# Patient Record
Sex: Male | Born: 1975 | Race: White | Hispanic: No | Marital: Married | Smoking: Former smoker
Health system: Southern US, Community
[De-identification: ages and names within clinical notes are randomized; demographics above are authoritative.]

## PROBLEM LIST (undated history)

## (undated) DIAGNOSIS — N201 Calculus of ureter: Secondary | ICD-10-CM

## (undated) DIAGNOSIS — Z87442 Personal history of urinary calculi: Secondary | ICD-10-CM

## (undated) DIAGNOSIS — K219 Gastro-esophageal reflux disease without esophagitis: Secondary | ICD-10-CM

## (undated) DIAGNOSIS — M545 Low back pain, unspecified: Secondary | ICD-10-CM

## (undated) DIAGNOSIS — F319 Bipolar disorder, unspecified: Secondary | ICD-10-CM

## (undated) DIAGNOSIS — Z87898 Personal history of other specified conditions: Secondary | ICD-10-CM

## (undated) DIAGNOSIS — Z915 Personal history of self-harm: Secondary | ICD-10-CM

## (undated) DIAGNOSIS — F32A Depression, unspecified: Secondary | ICD-10-CM

## (undated) DIAGNOSIS — H5440 Blindness, one eye, unspecified eye: Secondary | ICD-10-CM

## (undated) DIAGNOSIS — F329 Major depressive disorder, single episode, unspecified: Secondary | ICD-10-CM

## (undated) DIAGNOSIS — Z87828 Personal history of other (healed) physical injury and trauma: Secondary | ICD-10-CM

## (undated) DIAGNOSIS — Z973 Presence of spectacles and contact lenses: Secondary | ICD-10-CM

## (undated) DIAGNOSIS — K760 Fatty (change of) liver, not elsewhere classified: Secondary | ICD-10-CM

## (undated) DIAGNOSIS — F419 Anxiety disorder, unspecified: Secondary | ICD-10-CM

## (undated) DIAGNOSIS — R918 Other nonspecific abnormal finding of lung field: Secondary | ICD-10-CM

## (undated) DIAGNOSIS — Z872 Personal history of diseases of the skin and subcutaneous tissue: Secondary | ICD-10-CM

## (undated) DIAGNOSIS — K449 Diaphragmatic hernia without obstruction or gangrene: Secondary | ICD-10-CM

## (undated) DIAGNOSIS — G8929 Other chronic pain: Secondary | ICD-10-CM

## (undated) DIAGNOSIS — M1990 Unspecified osteoarthritis, unspecified site: Secondary | ICD-10-CM

## (undated) HISTORY — DX: Major depressive disorder, single episode, unspecified: F32.9

## (undated) HISTORY — PX: EXTRACORPOREAL SHOCK WAVE LITHOTRIPSY: SHX1557

## (undated) HISTORY — DX: Depression, unspecified: F32.A

## (undated) HISTORY — DX: Blindness, one eye, unspecified eye: H54.40

---

## 1997-07-21 HISTORY — PX: APPENDECTOMY: SHX54

## 1999-07-23 ENCOUNTER — Encounter (INDEPENDENT_AMBULATORY_CARE_PROVIDER_SITE_OTHER): Payer: Self-pay | Admitting: Specialist

## 1999-07-23 ENCOUNTER — Inpatient Hospital Stay (HOSPITAL_COMMUNITY): Admission: EM | Admit: 1999-07-23 | Discharge: 1999-07-24 | Payer: Self-pay | Admitting: Emergency Medicine

## 2000-07-21 ENCOUNTER — Inpatient Hospital Stay (HOSPITAL_COMMUNITY): Admission: EM | Admit: 2000-07-21 | Discharge: 2000-07-31 | Payer: Self-pay | Admitting: *Deleted

## 2000-07-21 DIAGNOSIS — Z9151 Personal history of suicidal behavior: Secondary | ICD-10-CM

## 2000-07-21 HISTORY — DX: Personal history of suicidal behavior: Z91.51

## 2003-02-15 ENCOUNTER — Emergency Department (HOSPITAL_COMMUNITY): Admission: EM | Admit: 2003-02-15 | Discharge: 2003-02-15 | Payer: Self-pay | Admitting: *Deleted

## 2004-08-26 ENCOUNTER — Emergency Department (HOSPITAL_COMMUNITY): Admission: EM | Admit: 2004-08-26 | Discharge: 2004-08-26 | Payer: Self-pay | Admitting: Emergency Medicine

## 2006-10-15 ENCOUNTER — Ambulatory Visit (HOSPITAL_COMMUNITY): Admission: RE | Admit: 2006-10-15 | Discharge: 2006-10-15 | Payer: Self-pay | Admitting: Sports Medicine

## 2006-11-11 ENCOUNTER — Encounter: Admission: RE | Admit: 2006-11-11 | Discharge: 2007-02-09 | Payer: Self-pay | Admitting: Sports Medicine

## 2007-04-01 ENCOUNTER — Encounter: Admission: RE | Admit: 2007-04-01 | Discharge: 2007-06-28 | Payer: Self-pay | Admitting: Sports Medicine

## 2008-07-17 ENCOUNTER — Encounter: Payer: Self-pay | Admitting: Orthopedic Surgery

## 2008-08-16 ENCOUNTER — Ambulatory Visit: Payer: Self-pay | Admitting: Orthopedic Surgery

## 2008-08-16 DIAGNOSIS — M549 Dorsalgia, unspecified: Secondary | ICD-10-CM | POA: Insufficient documentation

## 2008-08-22 ENCOUNTER — Encounter: Admission: RE | Admit: 2008-08-22 | Discharge: 2008-10-31 | Payer: Self-pay | Admitting: Orthopedic Surgery

## 2008-08-22 ENCOUNTER — Encounter: Payer: Self-pay | Admitting: Orthopedic Surgery

## 2008-09-05 ENCOUNTER — Telehealth: Payer: Self-pay | Admitting: Orthopedic Surgery

## 2008-09-21 ENCOUNTER — Encounter: Payer: Self-pay | Admitting: Orthopedic Surgery

## 2008-11-01 ENCOUNTER — Encounter: Payer: Self-pay | Admitting: Orthopedic Surgery

## 2009-03-22 ENCOUNTER — Emergency Department (HOSPITAL_COMMUNITY): Admission: EM | Admit: 2009-03-22 | Discharge: 2009-03-22 | Payer: Self-pay | Admitting: Emergency Medicine

## 2009-04-07 ENCOUNTER — Emergency Department (HOSPITAL_COMMUNITY): Admission: EM | Admit: 2009-04-07 | Discharge: 2009-04-07 | Payer: Self-pay | Admitting: Emergency Medicine

## 2009-04-18 ENCOUNTER — Emergency Department (HOSPITAL_COMMUNITY): Admission: EM | Admit: 2009-04-18 | Discharge: 2009-04-18 | Payer: Self-pay | Admitting: Emergency Medicine

## 2009-05-07 ENCOUNTER — Emergency Department (HOSPITAL_COMMUNITY): Admission: EM | Admit: 2009-05-07 | Discharge: 2009-05-07 | Payer: Self-pay | Admitting: Emergency Medicine

## 2009-05-09 ENCOUNTER — Ambulatory Visit (HOSPITAL_COMMUNITY): Admission: RE | Admit: 2009-05-09 | Discharge: 2009-05-09 | Payer: Self-pay | Admitting: Orthopedic Surgery

## 2009-05-15 ENCOUNTER — Emergency Department (HOSPITAL_COMMUNITY): Admission: EM | Admit: 2009-05-15 | Discharge: 2009-05-15 | Payer: Self-pay | Admitting: Emergency Medicine

## 2009-05-25 ENCOUNTER — Emergency Department (HOSPITAL_COMMUNITY): Admission: EM | Admit: 2009-05-25 | Discharge: 2009-05-25 | Payer: Self-pay | Admitting: Emergency Medicine

## 2009-06-06 ENCOUNTER — Emergency Department (HOSPITAL_COMMUNITY): Admission: EM | Admit: 2009-06-06 | Discharge: 2009-06-07 | Payer: Self-pay | Admitting: Emergency Medicine

## 2009-07-16 ENCOUNTER — Emergency Department (HOSPITAL_COMMUNITY): Admission: EM | Admit: 2009-07-16 | Discharge: 2009-07-16 | Payer: Self-pay | Admitting: Emergency Medicine

## 2009-09-06 ENCOUNTER — Emergency Department (HOSPITAL_COMMUNITY): Admission: EM | Admit: 2009-09-06 | Discharge: 2009-09-06 | Payer: Self-pay | Admitting: Emergency Medicine

## 2009-09-24 ENCOUNTER — Emergency Department (HOSPITAL_COMMUNITY): Admission: EM | Admit: 2009-09-24 | Discharge: 2009-09-25 | Payer: Self-pay | Admitting: Emergency Medicine

## 2009-10-12 ENCOUNTER — Ambulatory Visit (HOSPITAL_COMMUNITY): Admission: RE | Admit: 2009-10-12 | Discharge: 2009-10-12 | Payer: Self-pay | Admitting: Family Medicine

## 2009-11-09 ENCOUNTER — Emergency Department (HOSPITAL_COMMUNITY): Admission: EM | Admit: 2009-11-09 | Discharge: 2009-11-10 | Payer: Self-pay | Admitting: Emergency Medicine

## 2009-11-09 ENCOUNTER — Encounter: Payer: Self-pay | Admitting: Orthopedic Surgery

## 2009-11-09 DIAGNOSIS — Z87828 Personal history of other (healed) physical injury and trauma: Secondary | ICD-10-CM

## 2009-11-09 HISTORY — DX: Personal history of other (healed) physical injury and trauma: Z87.828

## 2009-11-12 ENCOUNTER — Ambulatory Visit: Payer: Self-pay | Admitting: Orthopedic Surgery

## 2009-11-12 DIAGNOSIS — S92919A Unspecified fracture of unspecified toe(s), initial encounter for closed fracture: Secondary | ICD-10-CM | POA: Insufficient documentation

## 2009-11-26 ENCOUNTER — Ambulatory Visit: Payer: Self-pay | Admitting: Orthopedic Surgery

## 2009-12-06 ENCOUNTER — Emergency Department (HOSPITAL_COMMUNITY): Admission: EM | Admit: 2009-12-06 | Discharge: 2009-12-06 | Payer: Self-pay | Admitting: Emergency Medicine

## 2009-12-10 ENCOUNTER — Ambulatory Visit: Payer: Self-pay | Admitting: Orthopedic Surgery

## 2009-12-18 ENCOUNTER — Telehealth: Payer: Self-pay | Admitting: Orthopedic Surgery

## 2009-12-20 ENCOUNTER — Telehealth: Payer: Self-pay | Admitting: Orthopedic Surgery

## 2010-01-07 ENCOUNTER — Telehealth (INDEPENDENT_AMBULATORY_CARE_PROVIDER_SITE_OTHER): Payer: Self-pay | Admitting: *Deleted

## 2010-01-08 ENCOUNTER — Telehealth: Payer: Self-pay | Admitting: Orthopedic Surgery

## 2010-02-18 ENCOUNTER — Ambulatory Visit: Payer: Self-pay | Admitting: Orthopedic Surgery

## 2010-02-19 ENCOUNTER — Encounter (INDEPENDENT_AMBULATORY_CARE_PROVIDER_SITE_OTHER): Payer: Self-pay | Admitting: *Deleted

## 2010-02-25 ENCOUNTER — Emergency Department (HOSPITAL_COMMUNITY): Admission: EM | Admit: 2010-02-25 | Discharge: 2010-02-26 | Payer: Self-pay | Admitting: Emergency Medicine

## 2010-03-12 ENCOUNTER — Emergency Department (HOSPITAL_COMMUNITY): Admission: EM | Admit: 2010-03-12 | Discharge: 2010-03-12 | Payer: Self-pay | Admitting: Emergency Medicine

## 2010-04-17 ENCOUNTER — Emergency Department (HOSPITAL_COMMUNITY): Admission: EM | Admit: 2010-04-17 | Discharge: 2010-04-17 | Payer: Self-pay | Admitting: Emergency Medicine

## 2010-04-25 ENCOUNTER — Emergency Department (HOSPITAL_COMMUNITY): Admission: EM | Admit: 2010-04-25 | Discharge: 2010-04-25 | Payer: Self-pay | Admitting: Emergency Medicine

## 2010-04-26 ENCOUNTER — Encounter (INDEPENDENT_AMBULATORY_CARE_PROVIDER_SITE_OTHER): Payer: Self-pay | Admitting: *Deleted

## 2010-05-01 ENCOUNTER — Ambulatory Visit: Payer: Self-pay | Admitting: Internal Medicine

## 2010-05-01 ENCOUNTER — Observation Stay (HOSPITAL_COMMUNITY): Admission: EM | Admit: 2010-05-01 | Discharge: 2010-05-02 | Payer: Self-pay | Admitting: Emergency Medicine

## 2010-05-02 ENCOUNTER — Encounter: Payer: Self-pay | Admitting: Internal Medicine

## 2010-05-03 ENCOUNTER — Encounter: Payer: Self-pay | Admitting: Internal Medicine

## 2010-05-03 ENCOUNTER — Emergency Department (HOSPITAL_COMMUNITY): Admission: EM | Admit: 2010-05-03 | Discharge: 2010-05-03 | Payer: Self-pay | Admitting: Emergency Medicine

## 2010-05-09 ENCOUNTER — Ambulatory Visit: Payer: Self-pay | Admitting: Internal Medicine

## 2010-05-09 ENCOUNTER — Encounter: Payer: Self-pay | Admitting: Internal Medicine

## 2010-05-09 DIAGNOSIS — L509 Urticaria, unspecified: Secondary | ICD-10-CM | POA: Insufficient documentation

## 2010-05-09 DIAGNOSIS — R231 Pallor: Secondary | ICD-10-CM

## 2010-05-09 DIAGNOSIS — R740 Nonspecific elevation of levels of transaminase and lactic acid dehydrogenase [LDH]: Secondary | ICD-10-CM

## 2010-05-13 ENCOUNTER — Telehealth: Payer: Self-pay | Admitting: Internal Medicine

## 2010-05-13 ENCOUNTER — Ambulatory Visit: Payer: Self-pay | Admitting: Internal Medicine

## 2010-06-07 ENCOUNTER — Emergency Department (HOSPITAL_COMMUNITY): Admission: EM | Admit: 2010-06-07 | Discharge: 2010-06-07 | Payer: Self-pay | Admitting: Emergency Medicine

## 2010-06-27 ENCOUNTER — Emergency Department (HOSPITAL_COMMUNITY): Admission: EM | Admit: 2010-06-27 | Discharge: 2010-05-03 | Payer: Self-pay | Admitting: Emergency Medicine

## 2010-07-01 ENCOUNTER — Ambulatory Visit: Payer: Self-pay | Admitting: Internal Medicine

## 2010-08-13 ENCOUNTER — Ambulatory Visit: Admit: 2010-08-13 | Payer: Self-pay
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
## 2010-08-20 NOTE — Miscellaneous (Signed)
Summary: refaxed notes to dr Ulice Brilliant  Clinical Lists Changes

## 2010-08-20 NOTE — Progress Notes (Signed)
Summary: call from patient still hurting  Phone Note Call from Patient   Caller: Patient Summary of Call: Patient called to request to speak w/nurse. States per last visit note, advised to contact our office after healed.  Said color of toe is "purplish-pink" and still hurts.  Asked about possibility of having something called in for pain.  I advised Dr Romeo Apple is out of the office this week.  I've scheduled for an appointment.  Still asked to speak w/nurse. Ph [phone] Initial call taken by: Cammie Sickle,  January 07, 2010 11:37 AM  Follow-up for Phone Call        called and lmom to call here Follow-up by: Ether Griffins,  January 08, 2010 8:36 AM

## 2010-08-20 NOTE — Letter (Signed)
Summary: History form  History form   Imported By: Jacklynn Ganong 11/13/2009 11:40:37  _____________________________________________________________________  External Attachment:    Type:   Image     Comment:   External Document
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
## 2010-08-20 NOTE — Progress Notes (Signed)
Summary: wants refill on Percocet 5  Phone Note Call from Patient Call back at [facility] Phone [phone]   Summary of Call: wants refill on Percocet 5, wants to pick up this thursday, he is going to be out of town for 2 weeks, leaving out Thursday,  Initial call taken by: Ether Griffins,  Dec 18, 2009 10:33 AM  Follow-up for Phone Call        this is restricted can not be filled early Follow-up by: Fuller Canada MD,  Dec 18, 2009 1:00 PM  Additional Follow-up for Phone Call Additional follow up Details #1::        his next refill is due for sunday 12/23/09 advised he can pick his rx up Friday, to call us thursday, we will put date on for 12/23/09 Additional Follow-up by: Ether Griffins,  Dec 18, 2009 1:15 PM

## 2010-08-20 NOTE — Progress Notes (Signed)
Summary: percocet rx needed  Phone Note Call from Patient Call back at Home Phone [phone]   Summary of Call: he is due for percocet 5 now, will you write please, thanks Initial call taken by: Chasity Tereasa Coop,  December 20, 2009 8:29 AM    Prescriptions: PERCOCET 5-325 MG TABS (OXYCODONE-ACETAMINOPHEN) 1-2 by mouth q 4 as needed pain  #90 x 0   Entered and Authorized by:   Fuller Canada MD   Signed by:   Fuller Canada MD on 12/20/2009   Method used:   Print then Give to Patient   RxID:   [id number]

## 2010-08-20 NOTE — Assessment & Plan Note (Signed)
Summary: NEW TO CLINIC/HFU/SB.   Vital Signs:  Patient profile:   35 year old male Height:      72 inches Weight:      221.5 pounds BMI:     30.15 Temp:     98.9 degrees F oral Pulse rate:   112 / minute BP sitting:   132 / 90  (right arm)  Vitals Entered By: Filomena Jungling NT II (May 09, 2010 1:57 PM) CC: HFU Is Patient Diabetic? No Pain Assessment Patient in pain? yes     Location: back Intensity: 8 Type: aching Onset of pain  3 YEARS Nutritional Status BMI of > 30 = obese  Have you ever been in a relationship where you felt threatened, hurt or afraid?No   Does patient need assistance? Functional Status Self care Ambulation Normal   Primary Care Provider:  Bethel Born MD  CC:  HFU.  History of Present Illness: 35 y/o m with h/o chronic back pain, bipolar, anxiety and urticaria for which he was recently discharged from ED comes for follow up.   his urticaria is reolved and he has completed prednsione.   he complaints of big rash on this back. the rash on the back is popped up 1 yr ago and never went away. it gets darker as the day progresses. he has some blisters on the back which pop up periodically and are sore. has seen a dermatologist in [place] a year ago and was told that its too deep in the skin for him to do anything about it.   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 has seen sports medicine and PT. hydrocortosone shot gave him a skin reaction. PT made the pain worse. tried vicodin, percocet, tramadol, naproxen, tylenol and ibuprofen.     Preventive Screening-Counseling & Management  Alcohol-Tobacco     Smoking Status: current     Packs/Day: 1  Caffeine-Diet-Exercise     Caffeine use/day: 5+  Current Medications (verified): 1)  Valium 10 Mg Tabs (Diazepam) .Marland Kitchen.. 1 Tab Four Times Daily 2)  Zantac 150 Mg Tabs (Ranitidine Hcl) .Marland Kitchen.. 1 Tablet By Mouth Daily 3)  Seroquel 25 Mg Tabs (Quetiapine  Fumarate) .Marland Kitchen.. 1 Tablet By Mouth At Bedtime 4)  Depakote 500 Mg Tbec (Divalproex Sodium) .Marland Kitchen.. 1 Tablet By Mouth At Bedtime  Allergies (verified): No Known Drug Allergies  Review of Systems  The patient denies anorexia, fever, weight loss, weight gain, vision loss, decreased hearing, hoarseness, chest pain, syncope, dyspnea on exertion, peripheral edema, prolonged cough, headaches, hemoptysis, abdominal pain, melena, hematochezia, severe indigestion/heartburn, hematuria, incontinence, genital sores, muscle weakness, suspicious skin lesions, transient blindness, difficulty walking, depression, unusual weight change, abnormal bleeding, enlarged lymph nodes, angioedema, breast masses, and testicular masses.    Physical Exam  General:  Gen: VS reveiwed, Alert, well developed, nodistress ENT: mucous membranes pink & moist. No abnormal finds in ear and nose. CVC:S1 S2 , no murmurs, no abnormal heart sounds. Lungs: Clear to auscultation B/L. No wheezes, crackles or other abnormal sounds Abdomen: soft, non distended, no tender. Normal Bowel sounds EXT: no pitting edema, no engorged veins, Pulsations normal  Neuro:alert, oriented *3, cranial nerved 2-12 intact, strenght normal in all  extremities, senstations normal to light touch.      Impression & Recommendations:  Problem # 1:  BACK PAIN (ICD-724.5) patient has been to multiple physcians, has tried all other meds including narcotics request narcotics today he has high abuse potential.  also he describes PT and hydrocortisone shots made his pain worse. I would like to try repeating PT at different place before starting narcotics in him. Will talk more once he gets his orange cards diclofenac for pain until then  His updated medication list for this problem includes:    Diclofenac Sodium 75 Mg Tbec (Diclofenac sodium) .Marland Kitchen... 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 will get onc he gets his orange card  Problem # 4:  BIPOLAR AFFECTIVE DISORDER, DEPRESSED, MODERATE (ICD-296.52) managed by mental health  Problem # 5:  UNSPECIFIED URTICARIA (ICD-708.9) resolved  Problem # 6:  INADEQUATE MATERIAL RESOURCES (ICD-V60.2) meet with Jaynee Eagles today for orange card  Complete Medication List: 1)  Valium 10 Mg Tabs (Diazepam) .Marland Kitchen.. 1 tab four times daily 2)  Zantac 150 Mg Tabs (Ranitidine hcl) .Marland Kitchen.. 1 tablet by mouth daily 3)  Seroquel 25 Mg Tabs (Quetiapine fumarate) .Marland Kitchen.. 1 tablet by mouth at bedtime 4)  Depakote 500 Mg Tbec (Divalproex sodium) .Marland Kitchen.. 1 tablet by mouth at bedtime 5)  Diclofenac Sodium 75 Mg Tbec (Diclofenac sodium) .... Take 1 tablet by mouth three times a day  Other Orders: T-CMP with Estimated GFR ([id number])  Patient Instructions: 1)  Please schedule a follow-up appointment in 3 months.  2)  See Jaynee Eagles for medication and insurance assistance Prescriptions: DICLOFENAC SODIUM 75 MG TBEC (DICLOFENAC SODIUM) Take 1 tablet by mouth three times a day  #90 x 0   Entered and Authorized by:   Bethel Born MD   Signed by:   Bethel Born MD on 05/09/2010   Method used:   Print then Give to Patient   RxID:   [id number] DICLOFENAC SODIUM 50 MG TBEC (DICLOFENAC SODIUM) Take 1 tablet by mouth three times a day  #90 x 0   Entered and Authorized by:   Bethel Born MD   Signed by:   Bethel Born MD on 05/09/2010   Method used:   Print then Give to Patient   RxID:   [id number]    Orders Added: 1)  T-CMP with Estimated GFR [[id number]] 2)  Est. Patient Level IV [02725]    Prevention & Chronic Care Immunizations   Influenza vaccine: Not  documented    Tetanus booster: Not documented    Pneumococcal vaccine: Not documented  Other Screening   Smoking status: current  (05/09/2010)  Lipids   Total Cholesterol: Not documented   LDL: Not documented   LDL Direct: Not documented   HDL: Not documented   Triglycerides: Not documented   Process Orders Check Orders Results:     [facility]: ABN not required for this insurance Tests Sent for requisitioning (May 09, 2010 3:27 PM):     05/09/2010: [facility] -- T-CMP with Estimated GFR [[id number]] (signed)
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
## 2010-08-20 NOTE — Miscellaneous (Signed)
Summary: PATIENT CONSENT FORM  PATIENT CONSENT FORM   Imported By: Louretta Parma 05/13/2010 16:54:32  _____________________________________________________________________  External Attachment:    Type:   Image     Comment:   External Document

## 2010-08-20 NOTE — Progress Notes (Signed)
Summary: phone/gg  Phone Note Call from Patient   Caller: Patient Summary of Call: Pt called and states he was given a Rx for  Diclofenac Sodium 75 Mg  and it's not helping his back pain.  He had some at home and tried it over the week-end.  He is requestion something else for the pain. Pt # [id number] Initial call taken by: Merrie Roof RN,  May 13, 2010 8:53 AM  Follow-up for Phone Call        patient will need to come in for pain contract for narcotics will prescribe vicodin on chronic bases for back pain  Follow-up by: Bethel Born MD,  May 13, 2010 11:29 AM     Appended Document: phone/gg Pt will be seen today
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
## 2010-08-20 NOTE — Discharge Summary (Signed)
Summary: Hospital Discharge Update    Hospital Discharge Update:  Date of Admission: 05/01/2010 Date of Discharge: 05/02/2010  Brief Summary:  35  yo male admitted with urticaria x2 days on trunk and upper extremities.  Patient was treated with Prednisone 40mg  and responded well to treatment.  He also had a elevated WBC which likely due to demargination/steroid.  He had a history of elevated transaminases so we ordered Hepatitis Panel.  Patient was sent home with Prednisone tapering x 3 days.  Lab or other results pending at discharge:  Hepatitis panel  Labs needed at follow-up: CBC with differential  Other labs needed at follow-up: Please follow leukocytosis and hepatitis panel  Other follow-up issues:  Questionable drug-seeking behavior?  Medication list changes:  Removed medication of ROBAXIN 500 MG TABS (METHOCARBAMOL) 1 by mouth Q 6 as needed Removed medication of NAPROSYN 500 MG TABS (NAPROXEN) 1 by mouth two times a day Removed medication of PERCOCET 5-325 MG TABS (OXYCODONE-ACETAMINOPHEN) 1-2 by mouth q 4 as needed pain Removed medication of KEFLEX 500 MG CAPS (CEPHALEXIN) 1 q 6 as needed pain Removed medication of NORCO 5-325 MG TABS (HYDROCODONE-ACETAMINOPHEN) 1 by mouth q 4 hrs as needed Removed medication of ULTRACET 37.5-325 MG TABS (TRAMADOL-ACETAMINOPHEN) 1 q 4 as needed pain Added new medication of ZYRTEC ALLERGY 10 MG TABS (CETIRIZINE HCL) 1 tablet by mouth once daily - Signed Added new medication of PREDNISONE 10 MG TABS (PREDNISONE) Take 3 tablets day 1, then 2 tablets day 2, then 1 tablet day 3, then stop - Signed Added new medication of VALIUM 10 MG TABS (DIAZEPAM) 1 tab four times daily - Signed Added new medication of ZANTAC 150 MG TABS (RANITIDINE HCL) 1 tablet by mouth daily - Signed Added new medication of SEROQUEL 25 MG TABS (QUETIAPINE FUMARATE) 1 tablet by mouth at bedtime Added new medication of DEPAKOTE 500 MG TBEC (DIVALPROEX SODIUM) 1 tablet by  mouth at bedtime Rx of ZYRTEC ALLERGY 10 MG TABS (CETIRIZINE HCL) 1 tablet by mouth once daily;  #14 x 0;  Signed;  Entered by: Rosana Berger MD;  Authorized by: Rosana Berger MD;  Method used: Electronically to [facility] [phone]*, [address], [place], [place], [place]  [address], Ph: [phone] or [phone], Fax: [phone] Rx of PREDNISONE 10 MG TABS (PREDNISONE) Take 3 tablets day 1, then 2 tablets day 2, then 1 tablet day 3, then stop;  #6 x 0;  Signed;  Entered by: Rosana Berger MD;  Authorized by: Rosana Berger MD;  Method used: Historical Rx of VALIUM 10 MG TABS (DIAZEPAM) 1 tab four times daily;  #120 x 0;  Signed;  Entered by: Rosana Berger MD;  Authorized by: Rosana Berger MD;  Method used: Historical Rx of ZANTAC 150 MG TABS (RANITIDINE HCL) 1 tablet by mouth daily;  #30 x 0;  Signed;  Entered by: Rosana Berger MD;  Authorized by: Rosana Berger MD;  Method used: Historical  The medication, problem, and allergy lists have been updated.  Please see the dictated discharge summary for details.  Discharge medications:  ZYRTEC ALLERGY 10 MG TABS (CETIRIZINE HCL) 1 tablet by mouth once daily PREDNISONE 10 MG TABS (PREDNISONE) Take 3 tablets day 1, then 2 tablets day 2, then 1 tablet day 3, then stop VALIUM 10 MG TABS (DIAZEPAM) 1 tab four times daily ZANTAC 150 MG TABS (RANITIDINE HCL) 1 tablet by mouth daily SEROQUEL 25 MG TABS (QUETIAPINE FUMARATE) 1 tablet by mouth at bedtime DEPAKOTE 500  MG TBEC (DIVALPROEX SODIUM) 1 tablet by mouth at bedtime

## 2010-08-20 NOTE — Miscellaneous (Signed)
Summary: faxed notes to podiatry for referral  Clinical Lists Changes

## 2010-08-20 NOTE — Discharge Summary (Signed)
Summary: Hospital Discharge Update    Hospital Discharge Update:  Date of Admission: 05/03/2010 Date of Discharge: 05/03/2010  Brief Summary:  Patient seen and evaluated in the ED, where he came due to swelling, itching and new lesions on his arm c/w urticaria, as previously diagnosed by admitting team the day before. Patient was d/c home and unable to filled his prescription, reason while he had this reaction. After examining the patient and discussing with Dr. Orvan Falconer the decision of discharging him from the ED was taken. He will complete 5 days total of prednisone by mouth and will also use bendaryl for itching and discomfort. He is significantly better in comparisson to 10/12 (day he was originally admitted). No other complaints or findings were appreciated during this ED visit.  Lab or other results pending at discharge:  Chronic hepatitis panel.  Labs needed at follow-up: Liver panel  Other follow-up issues:  Reorder a liver function panel to check trending of LFT's. Patient is also complaining of chronic back pain in his lumbar area and might required chronic pain control and MRI for further evaluation.  Medication list changes:  Changed medication from ZYRTEC ALLERGY 10 MG TABS (CETIRIZINE HCL) 1 tablet by mouth once daily to BENADRYL 25 MG TABS (DIPHENHYDRAMINE HCL) Take 1 tablet by mouth every 12 hours as needed for itching and skin discomfort. Changed medication from PREDNISONE 10 MG TABS (PREDNISONE) Take 3 tablets day 1, then 2 tablets day 2, then 1 tablet day 3, then stop to * PREDNISONE 20 MG TABS (PREDNISONE) Take 1 tablet by mouth daily for 2 days, then take 1/2 tablet by mouth daily for 3 days then stop.  The medication, problem, and allergy lists have been updated.  Please see the dictated discharge summary for details.  Discharge medications:  BENADRYL 25 MG TABS (DIPHENHYDRAMINE HCL) Take 1 tablet by mouth every 12 hours as needed for itching and skin discomfort. *  PREDNISONE 20 MG TABS (PREDNISONE) Take 1 tablet by mouth daily for 2 days, then take 1/2 tablet by mouth daily for 3 days then stop. VALIUM 10 MG TABS (DIAZEPAM) 1 tab four times daily ZANTAC 150 MG TABS (RANITIDINE HCL) 1 tablet by mouth daily SEROQUEL 25 MG TABS (QUETIAPINE FUMARATE) 1 tablet by mouth at bedtime DEPAKOTE 500 MG TBEC (DIVALPROEX SODIUM) 1 tablet by mouth at bedtime  Other patient instructions:  1-Please keep your appointment with Dr. Scot Dock in the [facility] on Oct 20. 2-Take your medications as prescribed. 3-Ok to use up to 1000mg  of tylenol daily for pain control. 4-Do not scratch and use benadryl for itching and discomfort.  Note: Hospital Discharge Medications & Other Instructions handout was printed, one copy for patient and a second copy to be placed in hospital chart.
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
## 2010-08-22 NOTE — Assessment & Plan Note (Signed)
Summary: Back pain   Vital Signs:  Patient profile:   35 year old male Height:      71 inches (180.34 cm) Weight:      234 pounds (106.36 kg) BMI:     32.75 Temp:     97.9 degrees F (36.61 degrees C) Pulse rate:   122 / minute BP sitting:   126 / 85  (right arm) Cuff size:   large  Vitals Entered By: Dorie Rank RN (July 01, 2010 2:52 PM) CC: mental health dr changed medicine - stopped Seroquel, started Risperdal - back "muscles feel rigid" lower back and legs - right leg "numb" - left leg "tingly sometimes too" Is Patient Diabetic? No Pain Assessment Patient in pain? yes     Location: lower back Intensity: 8-9 Type: stiff and tight Onset of pain  Cogentin does not help with stiffness - pt states pain worse when started Risperdal - legs feel "restless" Nutritional Status BMI of > 30 = obese  Does patient need assistance? Functional Status Self care Ambulation Normal   Primary Care Jathan Balling:  Bethel Born MD  CC:  mental health dr changed medicine - stopped Seroquel and started Risperdal - back "muscles feel rigid" lower back and legs - right leg "numb" - left leg "tingly sometimes too".  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 2011. He was started on diclofenac which he states did not relieve his pain. In the past he has tried all non steroidal, neuronitin, cymbalta, tylenol, tramadol which have not worked for him. At his last visit, he was instructed to follow up with Jaynee Eagles so that he could get the orange card and get a repeat MRI. Since his last visit, he submitted the necessary paperwork to [facility], however did not follow up with it.   He continues to complain of lower back pain, that is worse with movement. It is mainly a dull, constant throbbing pain. He now says that sometimes he thinks his right thigh and leg will go numb, but denies urinary  incontinence, bowel incontinence, no other neurologic deficits.   No other complaints or concerns today.     Preventive Screening-Counseling & Management  Alcohol-Tobacco     Smoking Status: current     Packs/Day: 0.5  Caffeine-Diet-Exercise     Caffeine use/day: 5+  Comments: smokes about 7 cigs a day  Current Medications (verified): 1)  Valium 10 Mg Tabs (Diazepam) .Marland Kitchen.. 1 Tab Four Times Daily 2)  Zantac 150 Mg Tabs (Ranitidine Hcl) .Marland Kitchen.. 1 Tablet By Mouth Daily 3)  Depakote 250 Mg Tbec (Divalproex Sodium) .... Take 3 Tabs By Mouth At Bedtime As Prescribed Psych 4)  Risperdal 1 Mg Tabs (Risperidone) .... 2 Tabs By Mouth Twice A Day As Prescribed By Mental Health [facility]  Allergies (verified): No Known Drug Allergies  Past History:  Past Medical History: Last updated: 08/16/2008 depression anxiety low back pain  Past Surgical History: Last updated: 08/16/2008 appendix  Family History: Last updated: 08/16/2008 FH of Cancer:  Family History of Diabetes Family History of Arthritis Hx, family, asthma  Social History: Last updated: 11/12/2009 Patient is married.  unemployed smokes 1/2 ppd cigs no alcohol use 5 cups of caffeine per day.  Risk Factors: Caffeine Use: 5+ (07/01/2010)  Risk Factors: Smoking Status: current (07/01/2010) Packs/Day: 0.5 (07/01/2010)  Social History: Packs/Day:  0.5  Review of  Systems      See HPI  Physical Exam  General:  alert and well-developed.  VS reviewed and wnl Neck:  supple.   Lungs:  normal respiratory effort and normal breath sounds.   Heart:  normal rate and regular rhythm.   Abdomen:  soft and non-tender.   Msk:  normal ROM, no joint tenderness, no joint swelling, no joint warmth, no redness over joints, no joint deformities, no joint instability, and no crepitation.   Pulses:  pulses 2+ DP and post tibial bilaterally  Extremities:  no lower extremity edema noted  Neurologic:  strength normal in all  extremities, sensation intact to light touch, gait normal, and DTRs symmetrical and normal.   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 In looking back at his admission history, patient did not have this further evaluated in terms of a connective tissue, coaguable state work up. It is unusual in terms of its distribution being only in the lower back. He said it developed after an injection in his lower back. Would consider further work up with ANA, etc when patient gets the orange card set up. Of note, he has been to derm before, but could not get a skin biopsy done at that time.   Problem # 2:  BACK PAIN (ICD-724.5) Secondary to DJD, with last MRI (09/2009) showing mild foraminal narrowing and stenosis L4-5, but no areas of nerve impingement. Plan was for pt get repeat MRI in case the MRI had new findings of nerve impingement. He has not received orange card yet and thus has not gotten the MRI yet. As mentioned above patient has a skin mottling pattern on his lower back that looks c/w livedo reticularis, and he has been experiencing this back pain since that began, so there may be an association between the two. He has no neurologic deficits warranting an emergent MRI at this time.   Plan: - Follow up with Jaynee Eagles and get orange card - Pursue further work up with MRI - No narcotic meds for now  The following medications were removed from the medication list:    Diclofenac Sodium 75 Mg Tbec (Diclofenac sodium) .Marland Kitchen... Take 1 tablet by mouth three times a day  Complete Medication List: 1)  Valium 10 Mg Tabs (Diazepam) .Marland Kitchen.. 1 tab four times daily 2)  Zantac 150 Mg Tabs (Ranitidine hcl) .Marland Kitchen.. 1 tablet by mouth daily 3)  Depakote 250 Mg Tbec (Divalproex sodium) ....  Take 3 tabs by mouth at bedtime as prescribed psych 4)  Risperdal 1 Mg Tabs (Risperidone) .... 2 tabs by mouth twice a day as prescribed by mental health [place]  Patient Instructions: 1)  Please follow up with Jaynee Eagles to get the orange card set up. 2)  Please follow up with mental health as needed. 3)  Please return to clinic if symptoms worsen.   Orders Added: 1)  Est. Patient Level III [98119]     Prevention & Chronic Care Immunizations   Influenza vaccine: Not documented    Tetanus booster: Not documented    Pneumococcal vaccine: Not documented  Other Screening   Smoking status: current  (07/01/2010)  Lipids   Total Cholesterol: Not documented   LDL: Not documented   LDL Direct: Not documented   HDL:  Not documented   Triglycerides: Not documented

## 2010-10-01 ENCOUNTER — Emergency Department (HOSPITAL_COMMUNITY)
Admission: EM | Admit: 2010-10-01 | Discharge: 2010-10-01 | Disposition: A | Discharge status: Home / Self Care | Payer: Self-pay | Attending: Emergency Medicine | Admitting: Emergency Medicine

## 2010-10-01 DIAGNOSIS — M79609 Pain in unspecified limb: Secondary | ICD-10-CM | POA: Insufficient documentation

## 2010-10-01 DIAGNOSIS — R21 Rash and other nonspecific skin eruption: Secondary | ICD-10-CM | POA: Insufficient documentation

## 2010-10-01 DIAGNOSIS — F319 Bipolar disorder, unspecified: Secondary | ICD-10-CM | POA: Insufficient documentation

## 2010-10-01 DIAGNOSIS — R209 Unspecified disturbances of skin sensation: Secondary | ICD-10-CM | POA: Insufficient documentation

## 2010-10-01 DIAGNOSIS — M545 Low back pain, unspecified: Secondary | ICD-10-CM | POA: Insufficient documentation

## 2010-10-02 LAB — CBC
HCT: 41.4 % (ref 39.0–52.0)
HCT: 47 % (ref 39.0–52.0)
MCH: 31.6 pg (ref 26.0–34.0)
MCH: 32 pg (ref 26.0–34.0)
MCH: 32.5 pg (ref 26.0–34.0)
MCH: 32.6 pg (ref 26.0–34.0)
MCHC: 34.5 g/dL (ref 30.0–36.0)
MCHC: 35.7 g/dL (ref 30.0–36.0)
MCHC: 35.8 g/dL (ref 30.0–36.0)
MCV: 91.2 fL (ref 78.0–100.0)
MCV: 91.4 fL (ref 78.0–100.0)
Platelets: 171 10*3/uL (ref 150–400)
Platelets: 189 10*3/uL (ref 150–400)
RBC: 4.87 MIL/uL (ref 4.22–5.81)
RBC: 5.44 MIL/uL (ref 4.22–5.81)
RDW: 12.5 % (ref 11.5–15.5)
RDW: 12.7 % (ref 11.5–15.5)
RDW: 12.8 % (ref 11.5–15.5)
RDW: 13.1 % (ref 11.5–15.5)

## 2010-10-02 LAB — POCT CARDIAC MARKERS: Myoglobin, poc: 40.4 ng/mL (ref 12–200)

## 2010-10-02 LAB — HEPATIC FUNCTION PANEL
AST: 57 U/L — ABNORMAL HIGH (ref 0–37)
Albumin: 3.5 g/dL (ref 3.5–5.2)
Alkaline Phosphatase: 90 U/L (ref 39–117)
Total Bilirubin: 0.7 mg/dL (ref 0.3–1.2)
Total Protein: 6.5 g/dL (ref 6.0–8.3)

## 2010-10-02 LAB — BASIC METABOLIC PANEL
BUN: 13 mg/dL (ref 6–23)
BUN: 8 mg/dL (ref 6–23)
CO2: 23 mEq/L (ref 19–32)
Calcium: 9.5 mg/dL (ref 8.4–10.5)
Chloride: 105 mEq/L (ref 96–112)
Creatinine, Ser: 0.97 mg/dL (ref 0.4–1.5)
GFR calc Af Amer: >60 mL/min (ref >60)
GFR calc non Af Amer: >60 mL/min (ref >60)
Glucose, Bld: 117 mg/dL — ABNORMAL HIGH (ref 70–99)
Glucose, Bld: 118 mg/dL — ABNORMAL HIGH (ref 70–99)
Glucose, Bld: 173 mg/dL — ABNORMAL HIGH (ref 70–99)
Potassium: 3.9 mEq/L (ref 3.5–5.1)
Potassium: 4.3 mEq/L (ref 3.5–5.1)

## 2010-10-02 LAB — HEPATITIS B DNA, ULTRAQUANTITATIVE, PCR

## 2010-10-02 LAB — DIFFERENTIAL
Basophils Absolute: 0 10*3/uL (ref 0.0–0.1)
Basophils Absolute: 0 10*3/uL (ref 0.0–0.1)
Basophils Relative: 0 % (ref 0–1)
Basophils Relative: 0 % (ref 0–1)
Eosinophils Absolute: 0.1 10*3/uL (ref 0.0–0.7)
Eosinophils Absolute: 0.2 10*3/uL (ref 0.0–0.7)
Eosinophils Relative: 3 % (ref 0–5)
Monocytes Absolute: 0.4 10*3/uL (ref 0.1–1.0)
Neutro Abs: 5.6 10*3/uL (ref 1.7–7.7)
Neutrophils Relative %: 86 % — ABNORMAL HIGH (ref 43–77)

## 2010-10-02 LAB — URINALYSIS, ROUTINE W REFLEX MICROSCOPIC
Hgb urine dipstick: NEGATIVE
Protein, ur: NEGATIVE mg/dL
Urobilinogen, UA: 0.2 mg/dL (ref 0.0–1.0)

## 2010-10-02 LAB — CULTURE, BLOOD (ROUTINE X 2): Culture  Setup Time: 201110121507

## 2010-10-02 LAB — HEPATITIS PANEL, ACUTE: HCV Ab: NEGATIVE

## 2010-10-02 LAB — LIPID PANEL
HDL: 43 mg/dL (ref >39)
Total CHOL/HDL Ratio: 4.7 RATIO

## 2010-10-02 LAB — TSH: TSH: 0.996 u[IU]/mL (ref 0.350–4.500)

## 2010-10-02 LAB — PROLACTIN: Prolactin: 3.9 ng/mL (ref 2.1–17.1)

## 2010-10-02 LAB — SEDIMENTATION RATE: Sed Rate: 3 mm/hr (ref 0–16)

## 2010-10-04 LAB — URINALYSIS, ROUTINE W REFLEX MICROSCOPIC
Hgb urine dipstick: NEGATIVE
Nitrite: NEGATIVE
Protein, ur: NEGATIVE mg/dL
Specific Gravity, Urine: 1.025 (ref 1.005–1.030)
Urobilinogen, UA: 1 mg/dL (ref 0.0–1.0)

## 2010-10-04 LAB — COMPREHENSIVE METABOLIC PANEL
ALT: 186 U/L — ABNORMAL HIGH (ref 0–53)
Alkaline Phosphatase: 118 U/L — ABNORMAL HIGH (ref 39–117)
BUN: 10 mg/dL (ref 6–23)
Chloride: 108 mEq/L (ref 96–112)
Glucose, Bld: 119 mg/dL — ABNORMAL HIGH (ref 70–99)
Potassium: 3.1 mEq/L — ABNORMAL LOW (ref 3.5–5.1)
Sodium: 140 mEq/L (ref 135–145)
Total Bilirubin: 0.5 mg/dL (ref 0.3–1.2)

## 2010-10-04 LAB — DIFFERENTIAL
Basophils Absolute: 0 10*3/uL (ref 0.0–0.1)
Basophils Relative: 0 % (ref 0–1)
Eosinophils Absolute: 0.2 10*3/uL (ref 0.0–0.7)
Monocytes Absolute: 0.6 10*3/uL (ref 0.1–1.0)
Neutro Abs: 6.8 10*3/uL (ref 1.7–7.7)
Neutrophils Relative %: 68 % (ref 43–77)

## 2010-10-04 LAB — CBC
HCT: 50.7 % (ref 39.0–52.0)
Hemoglobin: 17.3 g/dL — ABNORMAL HIGH (ref 13.0–17.0)
MCV: 93.3 fL (ref 78.0–100.0)
RBC: 5.44 MIL/uL (ref 4.22–5.81)
WBC: 9.9 10*3/uL (ref 4.0–10.5)

## 2010-10-11 LAB — URINALYSIS, ROUTINE W REFLEX MICROSCOPIC
Glucose, UA: NEGATIVE mg/dL
Hgb urine dipstick: NEGATIVE
Ketones, ur: NEGATIVE mg/dL
Protein, ur: NEGATIVE mg/dL
Urobilinogen, UA: 1 mg/dL (ref 0.0–1.0)

## 2010-10-17 ENCOUNTER — Emergency Department (HOSPITAL_COMMUNITY)
Admission: EM | Admit: 2010-10-17 | Discharge: 2010-10-18 | Disposition: A | Discharge status: Home / Self Care | Payer: Self-pay | Attending: Emergency Medicine | Admitting: Emergency Medicine

## 2010-10-17 DIAGNOSIS — IMO0002 Reserved for concepts with insufficient information to code with codable children: Secondary | ICD-10-CM | POA: Insufficient documentation

## 2010-10-17 DIAGNOSIS — X19XXXA Contact with other heat and hot substances, initial encounter: Secondary | ICD-10-CM | POA: Insufficient documentation

## 2010-10-17 DIAGNOSIS — F319 Bipolar disorder, unspecified: Secondary | ICD-10-CM | POA: Insufficient documentation

## 2010-10-17 DIAGNOSIS — G8929 Other chronic pain: Secondary | ICD-10-CM | POA: Insufficient documentation

## 2010-10-17 DIAGNOSIS — X500XXA Overexertion from strenuous movement or load, initial encounter: Secondary | ICD-10-CM | POA: Insufficient documentation

## 2010-10-17 DIAGNOSIS — S335XXA Sprain of ligaments of lumbar spine, initial encounter: Secondary | ICD-10-CM | POA: Insufficient documentation

## 2010-10-17 DIAGNOSIS — R21 Rash and other nonspecific skin eruption: Secondary | ICD-10-CM | POA: Insufficient documentation

## 2010-10-17 DIAGNOSIS — M545 Low back pain, unspecified: Secondary | ICD-10-CM | POA: Insufficient documentation

## 2010-10-17 DIAGNOSIS — Z79899 Other long term (current) drug therapy: Secondary | ICD-10-CM | POA: Insufficient documentation

## 2010-10-17 DIAGNOSIS — T2114XA Burn of first degree of lower back, initial encounter: Secondary | ICD-10-CM | POA: Insufficient documentation

## 2010-10-17 DIAGNOSIS — F341 Dysthymic disorder: Secondary | ICD-10-CM | POA: Insufficient documentation

## 2010-10-21 LAB — DIFFERENTIAL
Basophils Absolute: 0.1 10*3/uL (ref 0.0–0.1)
Eosinophils Relative: 4 % (ref 0–5)
Lymphocytes Relative: 27 % (ref 12–46)
Lymphs Abs: 2.4 10*3/uL (ref 0.7–4.0)
Monocytes Absolute: 0.4 10*3/uL (ref 0.1–1.0)
Monocytes Relative: 4 % (ref 3–12)
Neutro Abs: 5.8 10*3/uL (ref 1.7–7.7)

## 2010-10-21 LAB — COMPREHENSIVE METABOLIC PANEL
AST: 33 U/L (ref 0–37)
Albumin: 4.1 g/dL (ref 3.5–5.2)
BUN: 6 mg/dL (ref 6–23)
Calcium: 9.6 mg/dL (ref 8.4–10.5)
Creatinine, Ser: 0.93 mg/dL (ref 0.4–1.5)
GFR calc Af Amer: >60 mL/min (ref >60)
Total Bilirubin: 0.7 mg/dL (ref 0.3–1.2)
Total Protein: 7.2 g/dL (ref 6.0–8.3)

## 2010-10-21 LAB — CBC
HCT: 49.7 % (ref 39.0–52.0)
MCHC: 34.8 g/dL (ref 30.0–36.0)
MCV: 94.1 fL (ref 78.0–100.0)
Platelets: 224 10*3/uL (ref 150–400)
RDW: 13.2 % (ref 11.5–15.5)
WBC: 9 10*3/uL (ref 4.0–10.5)

## 2010-10-21 LAB — URINALYSIS, ROUTINE W REFLEX MICROSCOPIC
Bilirubin Urine: NEGATIVE
Glucose, UA: NEGATIVE mg/dL
Ketones, ur: NEGATIVE mg/dL
Protein, ur: NEGATIVE mg/dL
Specific Gravity, Urine: 1.016 (ref 1.005–1.030)
Urobilinogen, UA: 0.2 mg/dL (ref 0.0–1.0)
pH: 7.5 (ref 5.0–8.0)

## 2010-10-24 LAB — URINALYSIS, ROUTINE W REFLEX MICROSCOPIC
Bilirubin Urine: NEGATIVE
Glucose, UA: NEGATIVE mg/dL
Ketones, ur: NEGATIVE mg/dL
Protein, ur: NEGATIVE mg/dL

## 2010-10-25 LAB — URINALYSIS, ROUTINE W REFLEX MICROSCOPIC
Bilirubin Urine: NEGATIVE
Glucose, UA: NEGATIVE mg/dL
Hgb urine dipstick: NEGATIVE
Ketones, ur: NEGATIVE mg/dL
Nitrite: NEGATIVE
pH: 6.5 (ref 5.0–8.0)
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
## 2011-01-27 ENCOUNTER — Encounter: Payer: Self-pay | Admitting: Internal Medicine

## 2011-02-04 ENCOUNTER — Emergency Department (HOSPITAL_COMMUNITY)
Admission: EM | Admit: 2011-02-04 | Discharge: 2011-02-05 | Disposition: A | Discharge status: Home / Self Care | Payer: Self-pay | Attending: Emergency Medicine | Admitting: Emergency Medicine

## 2011-02-04 DIAGNOSIS — F319 Bipolar disorder, unspecified: Secondary | ICD-10-CM | POA: Insufficient documentation

## 2011-02-04 DIAGNOSIS — F411 Generalized anxiety disorder: Secondary | ICD-10-CM | POA: Insufficient documentation

## 2011-02-04 DIAGNOSIS — Z79899 Other long term (current) drug therapy: Secondary | ICD-10-CM | POA: Insufficient documentation

## 2011-02-04 DIAGNOSIS — X500XXA Overexertion from strenuous movement or load, initial encounter: Secondary | ICD-10-CM | POA: Insufficient documentation

## 2011-02-04 DIAGNOSIS — IMO0002 Reserved for concepts with insufficient information to code with codable children: Secondary | ICD-10-CM | POA: Insufficient documentation

## 2011-03-22 ENCOUNTER — Emergency Department (HOSPITAL_COMMUNITY)
Admission: EM | Admit: 2011-03-22 | Discharge: 2011-03-22 | Disposition: A | Discharge status: Home / Self Care | Payer: Self-pay | Attending: Emergency Medicine | Admitting: Emergency Medicine

## 2011-03-22 DIAGNOSIS — L509 Urticaria, unspecified: Secondary | ICD-10-CM | POA: Insufficient documentation

## 2011-03-22 DIAGNOSIS — F319 Bipolar disorder, unspecified: Secondary | ICD-10-CM | POA: Insufficient documentation

## 2011-03-22 LAB — CBC
HCT: 43.8 % (ref 39.0–52.0)
Hemoglobin: 15.1 g/dL (ref 13.0–17.0)
MCH: 30.3 pg (ref 26.0–34.0)
MCHC: 34.5 g/dL (ref 30.0–36.0)
MCV: 88 fL (ref 78.0–100.0)
Platelets: 190 10*3/uL (ref 150–400)
RBC: 4.98 MIL/uL (ref 4.22–5.81)
RDW: 13 % (ref 11.5–15.5)
WBC: 6.1 10*3/uL (ref 4.0–10.5)

## 2011-03-22 LAB — COMPREHENSIVE METABOLIC PANEL
ALT: 120 U/L — ABNORMAL HIGH (ref 0–53)
AST: 55 U/L — ABNORMAL HIGH (ref 0–37)
Albumin: 4.1 g/dL (ref 3.5–5.2)
Alkaline Phosphatase: 96 U/L (ref 39–117)
BUN: 10 mg/dL (ref 6–23)
CO2: 24 mEq/L (ref 19–32)
Calcium: 9.5 mg/dL (ref 8.4–10.5)
Chloride: 100 mEq/L (ref 96–112)
Creatinine, Ser: 0.95 mg/dL (ref 0.50–1.35)
GFR calc Af Amer: >60 mL/min (ref >60)
GFR calc non Af Amer: >60 mL/min (ref >60)
Glucose, Bld: 102 mg/dL — ABNORMAL HIGH (ref 70–99)
Potassium: 3.4 mEq/L — ABNORMAL LOW (ref 3.5–5.1)
Sodium: 137 mEq/L (ref 135–145)
Total Bilirubin: 0.5 mg/dL (ref 0.3–1.2)
Total Protein: 7.4 g/dL (ref 6.0–8.3)

## 2011-03-22 LAB — DIFFERENTIAL
Basophils Absolute: 0 10*3/uL (ref 0.0–0.1)
Basophils Relative: 1 % (ref 0–1)
Eosinophils Absolute: 0.2 10*3/uL (ref 0.0–0.7)
Eosinophils Relative: 3 % (ref 0–5)
Lymphocytes Relative: 40 % (ref 12–46)
Lymphs Abs: 2.5 10*3/uL (ref 0.7–4.0)
Monocytes Absolute: 0.4 10*3/uL (ref 0.1–1.0)
Monocytes Relative: 6 % (ref 3–12)
Neutro Abs: 3.1 10*3/uL (ref 1.7–7.7)
Neutrophils Relative %: 50 % (ref 43–77)

## 2011-04-07 ENCOUNTER — Encounter (HOSPITAL_COMMUNITY): Payer: Self-pay | Admitting: *Deleted

## 2011-04-07 ENCOUNTER — Emergency Department (HOSPITAL_COMMUNITY): Payer: Self-pay

## 2011-04-07 ENCOUNTER — Emergency Department (HOSPITAL_COMMUNITY)
Admission: EM | Admit: 2011-04-07 | Discharge: 2011-04-07 | Disposition: A | Discharge status: Home / Self Care | Payer: Self-pay | Attending: Emergency Medicine | Admitting: Emergency Medicine

## 2011-04-07 DIAGNOSIS — K279 Peptic ulcer, site unspecified, unspecified as acute or chronic, without hemorrhage or perforation: Secondary | ICD-10-CM | POA: Insufficient documentation

## 2011-04-07 DIAGNOSIS — R110 Nausea: Secondary | ICD-10-CM | POA: Insufficient documentation

## 2011-04-07 DIAGNOSIS — F17200 Nicotine dependence, unspecified, uncomplicated: Secondary | ICD-10-CM | POA: Insufficient documentation

## 2011-04-07 LAB — DIFFERENTIAL
Basophils Absolute: 0 10*3/uL (ref 0.0–0.1)
Basophils Relative: 0 % (ref 0–1)
Eosinophils Absolute: 0.2 10*3/uL (ref 0.0–0.7)
Neutrophils Relative %: 60 % (ref 43–77)

## 2011-04-07 LAB — COMPREHENSIVE METABOLIC PANEL
ALT: 82 U/L — ABNORMAL HIGH (ref 0–53)
AST: 35 U/L (ref 0–37)
Albumin: 3.8 g/dL (ref 3.5–5.2)
Alkaline Phosphatase: 90 U/L (ref 39–117)
BUN: 13 mg/dL (ref 6–23)
Potassium: 3 mEq/L — ABNORMAL LOW (ref 3.5–5.1)
Sodium: 139 mEq/L (ref 135–145)
Total Protein: 6.8 g/dL (ref 6.0–8.3)

## 2011-04-07 LAB — CBC
MCH: 32 pg (ref 26.0–34.0)
MCHC: 35.3 g/dL (ref 30.0–36.0)
Platelets: 187 10*3/uL (ref 150–400)

## 2011-04-07 LAB — LIPASE, BLOOD: Lipase: 33 U/L (ref 11–59)

## 2011-04-07 MED ORDER — POTASSIUM CHLORIDE 20 MEQ PO PACK
40.0000 meq | PACK | Freq: Once | ORAL | Status: AC
  Administered 2011-04-07: 40 meq via ORAL
  Filled 2011-04-07: qty 2

## 2011-04-07 MED ORDER — PROMETHAZINE HCL 25 MG PO TABS: 25.0000 mg | ORAL_TABLET | Freq: Four times a day (QID) | ORAL | Status: DC | PRN

## 2011-04-07 MED ORDER — RANITIDINE HCL 150 MG PO CAPS: 150.0000 mg | ORAL_CAPSULE | Freq: Two times a day (BID) | ORAL | Status: DC

## 2011-04-07 NOTE — ED Provider Notes (Signed)
History   Scribed for Alec Lennert, MD, the patient was seen in room APA07/APA07. This chart was scribed by Clarita Crane. This patient's care was started at 8:56PM.  CSN: [id number] Arrival date & time: 04/07/2011  8:44 PM   Chief Complaint  Patient presents with  . Nausea   HPI Alec Carter is a 35 y.o. male who presents to the Emergency Department complaining of constant nausea and vomiting onset 2 weeks ago and persistent since with associated upper abdominal pain, right sided rib pain and pruritus. Patient states he was evaluated at [facility] 2 weeks ago and kept overnight for observation due to left hand swelling and following discharge he began feeling nauseated which has been persistent since. Notes nausea and vomiting is worse with eating and not relieved by anything. Patient states abdominal pain is aggravated with palpation and right sided rib pain is aggravated with deep breathing and coughing. Denies chest pain, diaphoresis, fever, chills. Patient with h/o appendectomy, depression and anxiety.   HPI ELEMENTS: Onset: 2 weeks ago Duration: persistent since onset  Timing: constant    Modifying factors: Aggravated by eating and relieved by nothing  Context:  as above  Associated symptoms: +upper abdominal pain, right sided rib pain, pruritus. Denies chest pain, diaphoresis, fever, chills   PAST MEDICAL HISTORY:  Past Medical History  Diagnosis Date  . Depression   . Anxiety     PAST SURGICAL HISTORY:  Past Surgical History  Procedure Date  . Appendectomy     FAMILY HISTORY:  Family History  Problem Relation Age of Onset  . Diabetes Mother   . Diabetes Father   . Asthma Sister      SOCIAL HISTORY: History   Social History  . Marital Status: Married    Spouse Name: N/A    Number of Children: N/A  . Years of Education: N/A   Social History Main Topics  . Smoking status: Current Everyday Smoker -- 0.5 packs/day  . Smokeless tobacco: None  . Alcohol Use: No    . Drug Use: No  . Sexually Active:    Other Topics Concern  . None   Social History Narrative  . None      Review of Systems  Constitutional: Negative for fatigue.  HENT: Negative for congestion, sinus pressure and ear discharge.   Eyes: Negative for discharge.  Respiratory: Negative for cough.   Cardiovascular: Negative for chest pain.  Gastrointestinal: Positive for nausea, vomiting and abdominal pain. Negative for diarrhea.  Genitourinary: Negative for frequency and hematuria.  Musculoskeletal: Negative for back pain.  Skin: Negative for rash.       pruritus  Neurological: Negative for seizures and headaches.  Hematological: Negative.   Psychiatric/Behavioral: Negative for hallucinations.    Allergies  Review of patient's allergies indicates no known allergies.  Home Medications   Current Outpatient Rx  Name Route Sig Dispense Refill  . DIAZEPAM 10 MG PO TABS Oral Take 10 mg by mouth every 6 (six) hours as needed.      Marland Kitchen DIVALPROEX SODIUM 250 MG PO TBEC Oral Take 1,000 mg by mouth at bedtime.     . METHYLPHENIDATE HCL 10 MG PO TABS Oral Take 10 mg by mouth 2 (two) times daily.      Marland Kitchen MIRTAZAPINE 15 MG PO TABS Oral Take 15 mg by mouth at bedtime.      Marland Kitchen RANITIDINE HCL 150 MG PO TABS Oral Take 150 mg by mouth 2 (two) times daily.      Marland Kitchen  RISPERIDONE 1 MG PO TABS Oral Take 2 mg by mouth 2 (two) times daily.        Physical Exam    BP 124/82  Pulse 86  Temp(Src) 97.8 F (36.6 C) (Oral)  Resp 20  Ht 6' (1.829 m)  Wt 230 lb (104.327 kg)  BMI 31.19 kg/m2  SpO2 96%  Physical Exam  Nursing note and vitals reviewed. Constitutional: He is oriented to person, place, and time. He appears well-developed and well-nourished.  HENT:  Head: Normocephalic and atraumatic.  Eyes: Conjunctivae are normal. Pupils are equal, round, and reactive to light.  Neck: Neck supple.  Cardiovascular: Normal rate, regular rhythm and normal heart sounds.  Exam reveals no gallop and no  friction rub.   No murmur heard. Pulmonary/Chest: Effort normal and breath sounds normal. He has no wheezes.  Abdominal: Soft. Bowel sounds are normal. He exhibits no distension. There is tenderness in the right upper quadrant, epigastric area and left upper quadrant.  Musculoskeletal: Normal range of motion. He exhibits no edema.  Neurological: He is alert and oriented to person, place, and time. No sensory deficit.  Skin: Skin is warm and dry.  Psychiatric: He has a normal mood and affect. His behavior is normal.    ED Course  Procedures  OTHER DATA REVIEWED: Nursing notes, vital signs, and past medical records reviewed. Lab results reviewed and considered Imaging results reviewed and considered  DIAGNOSTIC STUDIES: Oxygen Saturation is 99% on room air, normal by my interpretation.    LABS / RADIOLOGY: Results for orders placed during the hospital encounter of 04/07/11  CBC      Component Value Range   WBC 9.1  4.0 - 10.5 (K/uL)   RBC 4.82  4.22 - 5.81 (MIL/uL)   Hemoglobin 15.4  13.0 - 17.0 (g/dL)   HCT 78.4  69.6 - 29.5 (%)   MCV 90.5  78.0 - 100.0 (fL)   MCH 32.0  26.0 - 34.0 (pg)   MCHC 35.3  30.0 - 36.0 (g/dL)   RDW 28.4  13.2 - 44.0 (%)   Platelets 187  150 - 400 (K/uL)  DIFFERENTIAL      Component Value Range   Neutrophils Relative 60  43 - 77 (%)   Neutro Abs 5.4  1.7 - 7.7 (K/uL)   Lymphocytes Relative 33  12 - 46 (%)   Lymphs Abs 3.0  0.7 - 4.0 (K/uL)   Monocytes Relative 5  3 - 12 (%)   Monocytes Absolute 0.4  0.1 - 1.0 (K/uL)   Eosinophils Relative 2  0 - 5 (%)   Eosinophils Absolute 0.2  0.0 - 0.7 (K/uL)   Basophils Relative 0  0 - 1 (%)   Basophils Absolute 0.0  0.0 - 0.1 (K/uL)  COMPREHENSIVE METABOLIC PANEL      Component Value Range   Sodium 139  135 - 145 (mEq/L)   Potassium 3.0 (*) 3.5 - 5.1 (mEq/L)   Chloride 106  96 - 112 (mEq/L)   CO2 22  19 - 32 (mEq/L)   Glucose, Bld 117 (*) 70 - 99 (mg/dL)   BUN 13  6 - 23 (mg/dL)   Creatinine, Ser 1.02   0.50 - 1.35 (mg/dL)   Calcium 9.1  8.4 - 72.5 (mg/dL)   Total Protein 6.8  6.0 - 8.3 (g/dL)   Albumin 3.8  3.5 - 5.2 (g/dL)   AST 35  0 - 37 (U/L)   ALT 82 (*) 0 - 53 (U/L)  Alkaline Phosphatase 90  39 - 117 (U/L)   Total Bilirubin 0.2 (*) 0.3 - 1.2 (mg/dL)   GFR calc non Af Amer >60  >60 (mL/min)   GFR calc Af Amer >60  >60 (mL/min)  LIPASE, BLOOD      Component Value Range   Lipase 33  11 - 59 (U/L)   Dg Abd Acute W/chest  04/07/2011  *RADIOLOGY REPORT*  Clinical Data: Epigastric pain after eating.  ACUTE ABDOMEN SERIES (ABDOMEN 2 VIEW & CHEST 1 VIEW)  Comparison:  None.  Findings:  There is no evidence of dilated bowel loops or free intraperitoneal air.  No radiopaque calculi or other significant radiographic abnormality is seen. Heart size and mediastinal contours are within normal limits.  Both lungs are clear. Surgical clips right lower quadrant.  Normal osseous structures.  IMPRESSION: Negative abdominal radiographs.  No acute cardiopulmonary disease.  Original Report Authenticated By: Elsie Stain, M.D.    ED COURSE / COORDINATION OF CARE: Orders Placed This Encounter  Procedures  . DG Abd Acute W/Chest  . CBC  . Differential  . Comprehensive metabolic panel  . Lipase, blood  10:46PM- Patient informed of lab and imaging results and intent to d/c home. Recommend follow up with [facility] if nausea persists. Patient agrees with plan set forth at this time.    ZOX:[id number],  Peptic ulcer    PLAN: Discharge Home The patient is to return the emergency department if there is any worsening of symptoms. I have reviewed the discharge instructions with the patient/family  CONDITION ON DISCHARGE: Good.  MEDICATIONS GIVEN IN THE E.D.  Medications  mirtazapine (REMERON) 15 MG tablet (not administered)  methylphenidate (RITALIN) 10 MG tablet (not administered)      The chart was scribed for me under my direct supervision.  I personally performed the history, physical, and  medical decision making and all procedures in the evaluation of this patient.Alec Lennert, MD 04/07/11 2251

## 2011-04-07 NOTE — ED Notes (Signed)
Nausea, vomting and loss of appetite, pain under right rib cage

## 2011-10-17 ENCOUNTER — Emergency Department (HOSPITAL_COMMUNITY): Payer: Self-pay

## 2011-10-17 ENCOUNTER — Emergency Department (HOSPITAL_COMMUNITY)
Admission: EM | Admit: 2011-10-17 | Discharge: 2011-10-17 | Disposition: A | Discharge status: Home / Self Care | Payer: Self-pay | Attending: Emergency Medicine | Admitting: Emergency Medicine

## 2011-10-17 ENCOUNTER — Encounter (HOSPITAL_COMMUNITY): Payer: Self-pay

## 2011-10-17 DIAGNOSIS — F329 Major depressive disorder, single episode, unspecified: Secondary | ICD-10-CM | POA: Insufficient documentation

## 2011-10-17 DIAGNOSIS — M542 Cervicalgia: Secondary | ICD-10-CM | POA: Insufficient documentation

## 2011-10-17 DIAGNOSIS — M545 Low back pain, unspecified: Secondary | ICD-10-CM | POA: Insufficient documentation

## 2011-10-17 DIAGNOSIS — F411 Generalized anxiety disorder: Secondary | ICD-10-CM | POA: Insufficient documentation

## 2011-10-17 DIAGNOSIS — F17200 Nicotine dependence, unspecified, uncomplicated: Secondary | ICD-10-CM | POA: Insufficient documentation

## 2011-10-17 DIAGNOSIS — R300 Dysuria: Secondary | ICD-10-CM | POA: Insufficient documentation

## 2011-10-17 DIAGNOSIS — M2550 Pain in unspecified joint: Secondary | ICD-10-CM | POA: Insufficient documentation

## 2011-10-17 DIAGNOSIS — S20229A Contusion of unspecified back wall of thorax, initial encounter: Secondary | ICD-10-CM | POA: Insufficient documentation

## 2011-10-17 DIAGNOSIS — W19XXXA Unspecified fall, initial encounter: Secondary | ICD-10-CM | POA: Insufficient documentation

## 2011-10-17 DIAGNOSIS — N390 Urinary tract infection, site not specified: Secondary | ICD-10-CM | POA: Insufficient documentation

## 2011-10-17 DIAGNOSIS — F3289 Other specified depressive episodes: Secondary | ICD-10-CM | POA: Insufficient documentation

## 2011-10-17 LAB — URINALYSIS, ROUTINE W REFLEX MICROSCOPIC
Bilirubin Urine: NEGATIVE
Nitrite: NEGATIVE
Specific Gravity, Urine: >1.03 — ABNORMAL HIGH (ref 1.005–1.030)
pH: 6 (ref 5.0–8.0)

## 2011-10-17 LAB — URINE MICROSCOPIC-ADD ON

## 2011-10-17 MED ORDER — CEPHALEXIN 500 MG PO CAPS: 500.0000 mg | ORAL_CAPSULE | Freq: Four times a day (QID) | ORAL | Status: AC

## 2011-10-17 MED ORDER — OXYCODONE-ACETAMINOPHEN 5-325 MG PO TABS
1.0000 | ORAL_TABLET | ORAL | Status: AC | PRN
Start: 2011-10-17 — End: 2011-10-27

## 2011-10-17 MED ORDER — METHOCARBAMOL 500 MG PO TABS: ORAL_TABLET | ORAL | Status: DC

## 2011-10-17 NOTE — ED Notes (Signed)
Slipped in shower on Monday,3/25,  Pain low back , upper back t-spine area and neck is "stiff"

## 2011-10-17 NOTE — Discharge Instructions (Signed)
Contusion A contusion is a deep bruise. Contusions are the result of an injury that caused bleeding under the skin. The contusion may turn blue, purple, or yellow. Minor injuries will give you a painless contusion, but more severe contusions may stay painful and swollen for a few weeks.  CAUSES  A contusion is usually caused by a blow, trauma, or direct force to an area of the body. SYMPTOMS   Swelling and redness of the injured area.   Bruising of the injured area.   Tenderness and soreness of the injured area.   Pain.  DIAGNOSIS  The diagnosis can be made by taking a history and physical exam. An X-ray, CT scan, or MRI may be needed to determine if there were any associated injuries, such as fractures. TREATMENT  Specific treatment will depend on what area of the body was injured. In general, the best treatment for a contusion is resting, icing, elevating, and applying cold compresses to the injured area. Over-the-counter medicines may also be recommended for pain control. Ask your caregiver what the best treatment is for your contusion. HOME CARE INSTRUCTIONS   Put ice on the injured area.   Put ice in a plastic bag.   Place a towel between your skin and the bag.   Leave the ice on for 15 to 20 minutes, 3 to 4 times a day.   Only take over-the-counter or prescription medicines for pain, discomfort, or fever as directed by your caregiver. Your caregiver may recommend avoiding anti-inflammatory medicines (aspirin, ibuprofen, and naproxen) for 48 hours because these medicines may increase bruising.   Rest the injured area.   If possible, elevate the injured area to reduce swelling.  SEEK IMMEDIATE MEDICAL CARE IF:   You have increased bruising or swelling.   You have pain that is getting worse.   Your swelling or pain is not relieved with medicines.  MAKE SURE YOU:   Understand these instructions.   Will watch your condition.   Will get help right away if you are not  doing well or get worse.  Document Released: 04/16/2005 Document Revised: 06/26/2011 Document Reviewed: 05/12/2011 [facility] Patient Information 2012 [place], [place].Urinary Tract Infection Infections of the urinary tract can start in several places. A bladder infection (cystitis), a kidney infection (pyelonephritis), and a prostate infection (prostatitis) are different types of urinary tract infections (UTIs). They usually get better if treated with medicines (antibiotics) that kill germs. Take all the medicine until it is gone. You or your child may feel better in a few days, but TAKE ALL MEDICINE or the infection may not respond and may become more difficult to treat. HOME CARE INSTRUCTIONS   Drink enough water and fluids to keep the urine clear or pale yellow. Cranberry juice is especially recommended, in addition to large amounts of water.   Avoid caffeine, tea, and carbonated beverages. They tend to irritate the bladder.   Alcohol may irritate the prostate.   Only take over-the-counter or prescription medicines for pain, discomfort, or fever as directed by your caregiver.  To prevent further infections:  Empty the bladder often. Avoid holding urine for long periods of time.   After a bowel movement, women should cleanse from front to back. Use each tissue only once.   Empty the bladder before and after sexual intercourse.  FINDING OUT THE RESULTS OF YOUR TEST Not all test results are available during your visit. If your or your child's test results are not back during the visit, make an  appointment with your caregiver to find out the results. Do not assume everything is normal if you have not heard from your caregiver or the medical facility. It is important for you to follow up on all test results. SEEK MEDICAL CARE IF:   There is back pain.   Your baby is older than 3 months with a rectal temperature of 100.5 F (38.1 C) or higher for more than 1 day.   Your or your child's  problems (symptoms) are no better in 3 days. Return sooner if you or your child is getting worse.  SEEK IMMEDIATE MEDICAL CARE IF:   There is severe back pain or lower abdominal pain.   You or your child develops chills.   You have a fever.   Your baby is older than 3 months with a rectal temperature of 102 F (38.9 C) or higher.   Your baby is 49 months old or younger with a rectal temperature of 100.4 F (38 C) or higher.   There is nausea or vomiting.   There is continued burning or discomfort with urination.  MAKE SURE YOU:   Understand these instructions.   Will watch your condition.   Will get help right away if you are not doing well or get worse.  Document Released: 04/16/2005 Document Revised: 06/26/2011 Document Reviewed: 11/19/2006 [facility] Patient Information 2012 [place], [place].

## 2011-10-17 NOTE — ED Notes (Signed)
Pt reports falling in the bath tub on Monday.  Pt reports hitting his neck, back, and tailbone during the fall.  Pt reports pain to all of those areas that worsens with movement.

## 2011-10-19 LAB — URINE CULTURE: Culture: NO GROWTH

## 2011-10-20 NOTE — ED Provider Notes (Signed)
Medical screening examination/treatment/procedure(s) were performed by non-physician practitioner and as supervising physician I was immediately available for consultation/collaboration.   Lezlee Gills L Kaho Selle, MD 10/20/11 2252 

## 2011-10-20 NOTE — ED Provider Notes (Signed)
History     CSN: [id number]  Arrival date & time 10/17/11  4098   First MD Initiated Contact with Patient 10/17/11 1900      Chief Complaint  Patient presents with  . Fall  . Neck Pain  . Back Pain    (Consider location/radiation/quality/duration/timing/severity/associated sxs/prior treatment) Patient is a 36 y.o. male presenting with fall, neck pain, and back pain. The history is provided by the patient. No language interpreter was used.  Fall The accident occurred more than 2 days ago. Incident: while standing in the bathtub. He fell from an unknown height. He landed on a hard floor. There was no blood loss. Point of impact: neck and back. Pain location: neck, lower back. The pain is moderate. He was ambulatory at the scene. There was no entrapment after the fall. There was no drug use involved in the accident. There was no alcohol use involved in the accident. Pertinent negatives include no visual change, no fever, no numbness, no abdominal pain, no bowel incontinence, no nausea, no vomiting, no hematuria, no headaches, no loss of consciousness and no tingling. The symptoms are aggravated by activity, standing and ambulation. He has tried nothing for the symptoms. The treatment provided no relief.  Neck Pain  This is a new problem. The current episode started more than 2 days ago. The problem occurs constantly. The problem has not changed since onset.The pain is associated with a fall. There has been no fever. The pain is present in the generalized neck. The quality of the pain is described as aching. The pain does not radiate. The pain is moderate. The symptoms are aggravated by bending, position and twisting. The pain is the same all the time. Pertinent negatives include no photophobia, no visual change, no numbness, no headaches, no bowel incontinence, no bladder incontinence, no leg pain, no paresis and no tingling.  Back Pain  The current episode started more than 2 days ago. The  problem occurs constantly. The problem has not changed since onset.The pain is associated with falling. The pain is present in the lumbar spine. The pain does not radiate. The pain is moderate. The symptoms are aggravated by bending, twisting and certain positions. The pain is the same all the time. Associated symptoms include dysuria. Pertinent negatives include no fever, no numbness, no headaches, no abdominal pain, no bowel incontinence, no bladder incontinence, no leg pain, no paresis and no tingling. He has tried nothing for the symptoms. The treatment provided no relief.    Past Medical History  Diagnosis Date  . Depression   . Anxiety     Past Surgical History  Procedure Date  . Appendectomy     Family History  Problem Relation Age of Onset  . Diabetes Mother   . Diabetes Father   . Asthma Sister     History  Substance Use Topics  . Smoking status: Current Everyday Smoker -- 0.5 packs/day  . Smokeless tobacco: Not on file  . Alcohol Use: No      Review of Systems  Constitutional: Negative for fever, activity change and appetite change.  HENT: Positive for neck pain. Negative for facial swelling.   Eyes: Negative for photophobia.  Respiratory: Negative for chest tightness.   Gastrointestinal: Negative for nausea, vomiting, abdominal pain and bowel incontinence.  Genitourinary: Positive for dysuria. Negative for bladder incontinence, urgency, hematuria, flank pain, decreased urine volume, discharge, difficulty urinating and testicular pain.  Musculoskeletal: Positive for back pain and arthralgias. Negative for gait problem.  Neurological: Negative for dizziness, tingling, loss of consciousness, numbness and headaches.  Psychiatric/Behavioral: Negative for confusion and decreased concentration.  All other systems reviewed and are negative.    Allergies  Review of patient's allergies indicates no known allergies.  Home Medications   Current Outpatient Rx  Name  Route Sig Dispense Refill  . DIAZEPAM 10 MG PO TABS Oral Take 10 mg by mouth every 6 (six) hours as needed.      Marland Kitchen DIVALPROEX SODIUM 250 MG PO TBEC Oral Take 1,000 mg by mouth at bedtime.     . IBUPROFEN 600 MG PO TABS Oral Take 600 mg by mouth every 6 (six) hours as needed. For pain    . MIRTAZAPINE 15 MG PO TABS Oral Take 15 mg by mouth at bedtime.      Marland Kitchen RANITIDINE HCL 150 MG PO CAPS Oral Take 1 capsule (150 mg total) by mouth 2 (two) times daily. 60 capsule 0  . RISPERIDONE 1 MG PO TABS Oral Take 2 mg by mouth 2 (two) times daily.      . CEPHALEXIN 500 MG PO CAPS Oral Take 1 capsule (500 mg total) by mouth 4 (four) times daily. For 7 days 28 capsule 0  . METHOCARBAMOL 500 MG PO TABS  Two tabs po TID prn muscle spasms 42 tablet 0  . OXYCODONE-ACETAMINOPHEN 5-325 MG PO TABS Oral Take 1 tablet by mouth every 4 (four) hours as needed for pain. 20 tablet 0    BP 133/97  Pulse 97  Temp(Src) 98 F (36.7 C) (Oral)  Resp 20  Ht 6\' 1"  (1.854 m)  Wt 238 lb (107.956 kg)  BMI 31.40 kg/m2  SpO2 98%  Physical Exam  Nursing note and vitals reviewed. Constitutional: He is oriented to person, place, and time. He appears well-developed and well-nourished. No distress.  HENT:  Head: Normocephalic and atraumatic.  Eyes: EOM are normal. Pupils are equal, round, and reactive to light.  Neck: Normal range of motion. Neck supple.  Cardiovascular: Normal rate, regular rhythm and normal heart sounds.   Pulmonary/Chest: Effort normal and breath sounds normal. He exhibits no tenderness.  Abdominal: Soft. He exhibits no distension. There is no tenderness.  Musculoskeletal: He exhibits tenderness. He exhibits no edema.       Cervical back: He exhibits tenderness and pain. He exhibits normal range of motion, no bony tenderness, no swelling, no laceration and normal pulse.       Thoracic back: He exhibits tenderness and pain. He exhibits normal range of motion, no bony tenderness, no swelling and normal pulse.         Lumbar back: He exhibits tenderness, bony tenderness and pain. He exhibits normal range of motion, no swelling, no edema, no deformity, no laceration and normal pulse.       Back:       ttp of the lumbar, thoracic and cervical paraspinal muscles.  No edema, abrasions, or bruising on exam.    Lymphadenopathy:    He has no cervical adenopathy.  Neurological: He is alert and oriented to person, place, and time. He has normal strength. No cranial nerve deficit or sensory deficit. He exhibits normal muscle tone. Coordination and gait normal.  Reflex Scores:      Tricep reflexes are 2+ on the right side and 2+ on the left side.      Bicep reflexes are 2+ on the right side and 2+ on the left side.      Brachioradialis reflexes are 2+ on  the right side and 2+ on the left side.      Patellar reflexes are 2+ on the right side and 2+ on the left side.      Achilles reflexes are 2+ on the right side and 2+ on the left side.   ED Course  Procedures (including critical care time)  Labs Reviewed  URINALYSIS, ROUTINE W REFLEX MICROSCOPIC - Abnormal; Notable for the following:    Color, Urine AMBER (*) BIOCHEMICALS MAY BE AFFECTED BY COLOR   Specific Gravity, Urine >1.030 (*)    Protein, ur TRACE (*)    All other components within normal limits  URINE MICROSCOPIC-ADD ON - Abnormal; Notable for the following:    Bacteria, UA FEW (*)    Crystals CA OXALATE CRYSTALS (*)    All other components within normal limits  URINE CULTURE  LAB REPORT - SCANNED    1. Contusion of back   2. Fall   3. Urinary tract infection    Urine culture is pending   MDM    Patient is alert, NAD.  Ambulates with a steady gait.  No focal neuro deficits on exam.  I will give him referral for ortho for f/u and he agrees to return here if his sx's worsen.  I will also treat him with antibiotics for UTI.     Patient / Family / Caregiver understand and agree with initial ED impression and plan with expectations set  for ED visit. Pt stable in ED with no significant deterioration in condition. Pt feels improved after observation and/or treatment in ED.          Emony Dormer L. [place], [place] 10/20/11 1817

## 2011-12-03 ENCOUNTER — Encounter (HOSPITAL_COMMUNITY): Payer: Self-pay | Admitting: *Deleted

## 2011-12-03 ENCOUNTER — Emergency Department (HOSPITAL_COMMUNITY): Payer: Self-pay

## 2011-12-03 ENCOUNTER — Emergency Department (HOSPITAL_COMMUNITY)
Admission: EM | Admit: 2011-12-03 | Discharge: 2011-12-03 | Disposition: A | Discharge status: Home / Self Care | Payer: Self-pay | Attending: Emergency Medicine | Admitting: Emergency Medicine

## 2011-12-03 DIAGNOSIS — Z79899 Other long term (current) drug therapy: Secondary | ICD-10-CM | POA: Insufficient documentation

## 2011-12-03 DIAGNOSIS — R221 Localized swelling, mass and lump, neck: Secondary | ICD-10-CM | POA: Insufficient documentation

## 2011-12-03 DIAGNOSIS — Y92009 Unspecified place in unspecified non-institutional (private) residence as the place of occurrence of the external cause: Secondary | ICD-10-CM | POA: Insufficient documentation

## 2011-12-03 DIAGNOSIS — R51 Headache: Secondary | ICD-10-CM | POA: Insufficient documentation

## 2011-12-03 DIAGNOSIS — W2209XA Striking against other stationary object, initial encounter: Secondary | ICD-10-CM | POA: Insufficient documentation

## 2011-12-03 DIAGNOSIS — F341 Dysthymic disorder: Secondary | ICD-10-CM | POA: Insufficient documentation

## 2011-12-03 DIAGNOSIS — H5440 Blindness, one eye, unspecified eye: Secondary | ICD-10-CM | POA: Insufficient documentation

## 2011-12-03 DIAGNOSIS — R220 Localized swelling, mass and lump, head: Secondary | ICD-10-CM | POA: Insufficient documentation

## 2011-12-03 DIAGNOSIS — S0033XA Contusion of nose, initial encounter: Secondary | ICD-10-CM

## 2011-12-03 DIAGNOSIS — R040 Epistaxis: Secondary | ICD-10-CM | POA: Insufficient documentation

## 2011-12-03 DIAGNOSIS — S0003XA Contusion of scalp, initial encounter: Secondary | ICD-10-CM | POA: Insufficient documentation

## 2011-12-03 DIAGNOSIS — S0993XA Unspecified injury of face, initial encounter: Secondary | ICD-10-CM | POA: Insufficient documentation

## 2011-12-03 DIAGNOSIS — J3489 Other specified disorders of nose and nasal sinuses: Secondary | ICD-10-CM | POA: Insufficient documentation

## 2011-12-03 MED ORDER — HYDROCODONE-ACETAMINOPHEN 5-325 MG PO TABS: 1.0000 | ORAL_TABLET | ORAL | Status: AC | PRN

## 2011-12-03 NOTE — ED Provider Notes (Signed)
History     CSN: [id number]  Arrival date & time 12/03/11  4098   First MD Initiated Contact with Patient 12/03/11 0940      Chief Complaint  Patient presents with  . Facial Injury    (Consider location/radiation/quality/duration/timing/severity/associated sxs/prior treatment) HPI Comments: Alec Carter ran into a door late last night walking in the dark to the bathroom, hitting his nose on the corner of a door jam.  He had immediate pain and had minutes of epistaxis which resolved with gentle pressure.  He has continued pain, swelling and now has bruising across his nasal bridge.  He denies LOC, but he stated he did see "stars" for a minute after the event.  He denies any nausea, dizziness, or headache, he does have nasal congestion but no further bleeding from the nose.  He used ice and ibuprofen prior to arrival.  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    Patient is a 36 y.o. male presenting with facial injury. The history is provided by the patient.  Facial Injury  The incident occurred today. The incident occurred at home. Pertinent negatives include no chest pain, no numbness, no abdominal pain, no nausea, no headaches, no hearing loss, no neck pain, no light-headedness and no weakness.    Past Medical History  Diagnosis Date  . Depression   . Anxiety     Past Surgical History  Procedure Date  . Appendectomy     Family History  Problem Relation Age of Onset  . Diabetes Mother   . Diabetes Father   . Asthma Sister     History  Substance Use Topics  . Smoking status: Current Everyday Smoker -- 0.5 packs/day    Types: Cigarettes  . Smokeless tobacco: Not on file  . Alcohol Use: No      Review of Systems  Constitutional: Negative for fever.  HENT: Positive for nosebleeds, congestion and facial swelling. Negative for hearing loss, ear pain, sore throat, neck pain, neck stiffness, sinus pressure and ear discharge.   Eyes:  Negative.   Respiratory: Negative for chest tightness and shortness of breath.   Cardiovascular: Negative for chest pain.  Gastrointestinal: Negative for nausea and abdominal pain.  Genitourinary: Negative.   Musculoskeletal: Negative for joint swelling and arthralgias.  Skin: Negative.  Negative for rash and wound.  Neurological: Negative for dizziness, weakness, light-headedness, numbness and headaches.  Hematological: Negative.   Psychiatric/Behavioral: Negative.     Allergies  Review of patient's allergies indicates no known allergies.  Home Medications   Current Outpatient Rx  Name Route Sig Dispense Refill  . DIAZEPAM 10 MG PO TABS Oral Take 10 mg by mouth every 6 (six) hours as needed.      Marland Kitchen DIVALPROEX SODIUM 250 MG PO TBEC Oral Take 1,000 mg by mouth at bedtime.     Marland Kitchen HYDROCODONE-ACETAMINOPHEN 5-325 MG PO TABS Oral Take 1 tablet by mouth every 4 (four) hours as needed for pain. 20 tablet 0  . IBUPROFEN 600 MG PO TABS Oral Take 600 mg by mouth every 6 (six) hours as needed. For pain    . METHOCARBAMOL 500 MG PO TABS  Two tabs po TID prn muscle spasms 42 tablet 0  . MIRTAZAPINE 15 MG PO TABS Oral Take 15 mg by mouth at bedtime.      Marland Kitchen RANITIDINE HCL 150 MG PO CAPS Oral Take 1 capsule (150 mg total) by mouth 2 (two) times  daily. 60 capsule 0    BP 127/90  Pulse 111  Temp(Src) 97.6 F (36.4 C) (Oral)  Resp 16  Ht 6' (1.829 m)  Wt 235 lb (106.595 kg)  BMI 31.87 kg/m2  SpO2 98%  Physical Exam  Nursing note and vitals reviewed. Constitutional: He appears well-developed and well-nourished.  HENT:  Head: Normocephalic.  Right Ear: Tympanic membrane and external ear normal. No hemotympanum.  Left Ear: Tympanic membrane and external ear normal. No hemotympanum.  Nose: Mucosal edema present. No nasal deformity or nasal septal hematoma.  Mouth/Throat: Uvula is midline and oropharynx is clear and moist.       Mucosal edema noted of left nares.  No active bleeding.  Moderate  ecchymosis across bridge of nose.  Eyes: Conjunctivae are normal.  Neck: Normal range of motion.  Cardiovascular: Normal rate, regular rhythm, normal heart sounds and intact distal pulses.   Pulmonary/Chest: Effort normal and breath sounds normal. He has no wheezes.  Abdominal: Soft. Bowel sounds are normal. There is no tenderness.  Musculoskeletal: Normal range of motion.  Neurological: He is alert.  Skin: Skin is warm and dry.  Psychiatric: He has a normal mood and affect.    ED Course  Procedures (including critical care time)  Labs Reviewed - No data to display Dg Nasal Bones  12/03/2011  *RADIOLOGY REPORT*  Clinical Data: Hit nose walking into a door jam  NASAL BONES - 3+ VIEW  Comparison: None.  Findings: No nasal bone fracture is seen.  The nasal spine appears intact.  The paranasal sinuses are clear.  IMPRESSION: No nasal bone fracture.  Original Report Authenticated By: Juline Patch, M.D.     1. Nasal contusion       MDM  Patient prescribed hydrocodone.  Encouraged to continue ibuprofen and ice.  Reassurance given that he has no nasal fractures and this injury should heal with time.  He was encouraged to followup with his PCP for recheck or return here for any problems or concerns.       Burgess Amor, PA 12/03/11 1101

## 2011-12-03 NOTE — ED Provider Notes (Signed)
Medical screening examination/treatment/procedure(s) were performed by non-physician practitioner and as supervising physician I was immediately available for consultation/collaboration.   Benny Lennert, MD 12/03/11 [phone]

## 2011-12-03 NOTE — ED Notes (Signed)
Pt states that he walked into the door jamb last night and injured his nose. Pt states that he saw stars but did not pass out. Pt states that it bled for a few minutes. Bruising noted to bridge of nose.

## 2011-12-03 NOTE — Discharge Instructions (Signed)
Contusion A contusion is a deep bruise. Contusions are the result of an injury that caused bleeding under the skin. The contusion may turn blue, purple, or yellow. Minor injuries will give you a painless contusion, but more severe contusions may stay painful and swollen for a few weeks.  CAUSES  A contusion is usually caused by a blow, trauma, or direct force to an area of the body. SYMPTOMS   Swelling and redness of the injured area.   Bruising of the injured area.   Tenderness and soreness of the injured area.   Pain.  DIAGNOSIS  The diagnosis can be made by taking a history and physical exam. An X-ray, CT scan, or MRI may be needed to determine if there were any associated injuries, such as fractures. TREATMENT  Specific treatment will depend on what area of the body was injured. In general, the best treatment for a contusion is resting, icing, elevating, and applying cold compresses to the injured area. Over-the-counter medicines may also be recommended for pain control. Ask your caregiver what the best treatment is for your contusion. HOME CARE INSTRUCTIONS   Put ice on the injured area.   Put ice in a plastic bag.   Place a towel between your skin and the bag.   Leave the ice on for 15 to 20 minutes, 3 to 4 times a day.   Only take over-the-counter or prescription medicines for pain, discomfort, or fever as directed by your caregiver. Your caregiver may recommend avoiding anti-inflammatory medicines (aspirin, ibuprofen, and naproxen) for 48 hours because these medicines may increase bruising.   Rest the injured area.   If possible, elevate the injured area to reduce swelling.  SEEK IMMEDIATE MEDICAL CARE IF:   You have increased bruising or swelling.   You have pain that is getting worse.   Your swelling or pain is not relieved with medicines.  MAKE SURE YOU:   Understand these instructions.   Will watch your condition.   Will get help right away if you are not  doing well or get worse.  Document Released: 04/16/2005 Document Revised: 06/26/2011 Document Reviewed: 05/12/2011 [facility] Patient Information 2012 [place], [place].    Your x-rays are negative for any bony injury.  You may continue using ice which will help with swelling.  I also encourage you to continue using your ibuprofen for inflammation.  You have been prescribed hydrocodone for additional pain relief, however do not drive within 4 hours of taking as this will make you drowsy.  Please see your Dr. for recheck if you are not improving over the next week.

## 2012-01-20 ENCOUNTER — Emergency Department (HOSPITAL_COMMUNITY)
Admission: EM | Admit: 2012-01-20 | Discharge: 2012-01-21 | Disposition: A | Discharge status: Home / Self Care | Payer: Self-pay | Attending: Emergency Medicine | Admitting: Emergency Medicine

## 2012-01-20 ENCOUNTER — Encounter (HOSPITAL_COMMUNITY): Payer: Self-pay | Admitting: *Deleted

## 2012-01-20 ENCOUNTER — Emergency Department (HOSPITAL_COMMUNITY): Payer: Self-pay

## 2012-01-20 DIAGNOSIS — R7989 Other specified abnormal findings of blood chemistry: Secondary | ICD-10-CM

## 2012-01-20 DIAGNOSIS — Y92009 Unspecified place in unspecified non-institutional (private) residence as the place of occurrence of the external cause: Secondary | ICD-10-CM | POA: Insufficient documentation

## 2012-01-20 DIAGNOSIS — W1809XA Striking against other object with subsequent fall, initial encounter: Secondary | ICD-10-CM | POA: Insufficient documentation

## 2012-01-20 DIAGNOSIS — M549 Dorsalgia, unspecified: Secondary | ICD-10-CM | POA: Insufficient documentation

## 2012-01-20 DIAGNOSIS — R51 Headache: Secondary | ICD-10-CM

## 2012-01-20 DIAGNOSIS — M542 Cervicalgia: Secondary | ICD-10-CM | POA: Insufficient documentation

## 2012-01-20 DIAGNOSIS — F17200 Nicotine dependence, unspecified, uncomplicated: Secondary | ICD-10-CM | POA: Insufficient documentation

## 2012-01-20 DIAGNOSIS — Z79899 Other long term (current) drug therapy: Secondary | ICD-10-CM | POA: Insufficient documentation

## 2012-01-20 DIAGNOSIS — F341 Dysthymic disorder: Secondary | ICD-10-CM | POA: Insufficient documentation

## 2012-01-20 DIAGNOSIS — G8929 Other chronic pain: Secondary | ICD-10-CM

## 2012-01-20 LAB — CBC WITH DIFFERENTIAL/PLATELET
Basophils Absolute: 0 K/uL (ref 0.0–0.1)
Basophils Relative: 0 % (ref 0–1)
Eosinophils Absolute: 0.4 K/uL (ref 0.0–0.7)
Eosinophils Relative: 3 % (ref 0–5)
HCT: 51.9 % (ref 39.0–52.0)
Hemoglobin: 19.1 g/dL — ABNORMAL HIGH (ref 13.0–17.0)
Lymphocytes Relative: 32 % (ref 12–46)
Lymphs Abs: 3.5 10*3/uL (ref 0.7–4.0)
MCH: 32.5 pg (ref 26.0–34.0)
MCHC: 36.8 g/dL — ABNORMAL HIGH (ref 30.0–36.0)
MCV: 88.3 fL (ref 78.0–100.0)
Monocytes Absolute: 0.5 K/uL (ref 0.1–1.0)
Monocytes Relative: 5 % (ref 3–12)
Neutro Abs: 6.5 10*3/uL (ref 1.7–7.7)
Neutrophils Relative %: 59 % (ref 43–77)
Platelets: 224 K/uL (ref 150–400)
RBC: 5.88 MIL/uL — ABNORMAL HIGH (ref 4.22–5.81)
RDW: 13 % (ref 11.5–15.5)
WBC: 10.9 K/uL — ABNORMAL HIGH (ref 4.0–10.5)

## 2012-01-20 LAB — COMPREHENSIVE METABOLIC PANEL WITH GFR
ALT: 129 U/L — ABNORMAL HIGH (ref 0–53)
AST: 62 U/L — ABNORMAL HIGH (ref 0–37)
Albumin: 4.2 g/dL (ref 3.5–5.2)
Calcium: 9.4 mg/dL (ref 8.4–10.5)
Creatinine, Ser: 0.93 mg/dL (ref 0.50–1.35)
GFR calc non Af Amer: >90 mL/min (ref >90)
Sodium: 139 meq/L (ref 135–145)
Total Protein: 7.8 g/dL (ref 6.0–8.3)

## 2012-01-20 LAB — COMPREHENSIVE METABOLIC PANEL
Alkaline Phosphatase: 105 U/L (ref 39–117)
BUN: 13 mg/dL (ref 6–23)
CO2: 20 mEq/L (ref 19–32)
Chloride: 102 mEq/L (ref 96–112)
GFR calc Af Amer: >90 mL/min (ref >90)
Glucose, Bld: 131 mg/dL — ABNORMAL HIGH (ref 70–99)
Potassium: 3.5 mEq/L (ref 3.5–5.1)
Total Bilirubin: 0.4 mg/dL (ref 0.3–1.2)

## 2012-01-20 NOTE — ED Notes (Signed)
The pt has had a headache for 3 weeks after he fell in the tub x 3 times.  Headache since then

## 2012-01-21 ENCOUNTER — Encounter (HOSPITAL_COMMUNITY): Payer: Self-pay | Admitting: Emergency Medicine

## 2012-01-21 MED ORDER — PROMETHAZINE HCL 25 MG PO TABS: 25.0000 mg | ORAL_TABLET | Freq: Four times a day (QID) | ORAL | Status: DC | PRN

## 2012-01-21 MED ORDER — PREDNISONE 20 MG PO TABS: 40.0000 mg | ORAL_TABLET | Freq: Every day | ORAL | Status: AC

## 2012-01-21 MED ORDER — ACETAMINOPHEN-CODEINE #3 300-30 MG PO TABS: 1.0000 | ORAL_TABLET | Freq: Four times a day (QID) | ORAL | Status: AC | PRN

## 2012-01-21 MED ORDER — KETOROLAC TROMETHAMINE 30 MG/ML IJ SOLN
30.0000 mg | Freq: Once | INTRAMUSCULAR | Status: AC
  Administered 2012-01-21: 30 mg via INTRAMUSCULAR
  Filled 2012-01-21: qty 1

## 2012-01-21 NOTE — Discharge Instructions (Signed)
Narcotic and benzodiazepine use may cause drowsiness, slowed breathing or dependence.  Please use with caution and do not drive, operate machinery or watch young children alone while taking them.  Taking combinations of these medications or drinking alcohol will potentiate these effects.    

## 2012-01-21 NOTE — ED Provider Notes (Signed)
History     CSN: [id number]  Arrival date & time 01/20/12  2100   First MD Initiated Contact with Patient 01/21/12 0120      Chief Complaint  Patient presents with  . Fall    (Consider location/radiation/quality/duration/timing/severity/associated sxs/prior treatment) HPI Comments: Patient reports that he's had problems with his right leg giving out causing him to fall. Patient reports he has a history of chronic back problems with known herniated discs. He denies any recent or new injury. He denies any fever, new back pain, urinary or bowel difficulty. He reports a couple weeks ago, he did fall and strike his head on the bathtub and he has had a persistent headache since then. He reports the headache will wax and wane and at times develops very severe photophobia and phonophobia. He denies any vomiting. He denies prior history of migraine. He denies any focal numbness or weakness. He denies loss of vision. He denies change in vision. He reports that he does have a new skin lesion on his low back which is developed gradually. He reports that he does not drink alcohol and does not think that he has ever had hepatitis in the past. From triage, the patient did have blood tests as well as CT scans ordered prior to my involvement in the patient's care.  The history is provided by the patient and medical records.    Past Medical History  Diagnosis Date  . Depression   . Anxiety     Past Surgical History  Procedure Date  . Appendectomy     Family History  Problem Relation Age of Onset  . Diabetes Mother   . Diabetes Father   . Asthma Sister     History  Substance Use Topics  . Smoking status: Current Everyday Smoker -- 0.5 packs/day    Types: Cigarettes  . Smokeless tobacco: Not on file  . Alcohol Use: No      Review of Systems 10 point review of systems was obtained. All are negative except for those specified in the above history.  Allergies  Review of patient's  allergies indicates no known allergies.  Home Medications   Current Outpatient Rx  Name Route Sig Dispense Refill  . DIAZEPAM 10 MG PO TABS Oral Take 10 mg by mouth 4 (four) times daily. anxiety    . DIVALPROEX SODIUM 250 MG PO TBEC Oral Take 1,000 mg by mouth at bedtime.     Marland Kitchen LORAZEPAM 1 MG PO TABS Oral Take 1 mg by mouth 3 (three) times daily.    Marland Kitchen MIRTAZAPINE 15 MG PO TABS Oral Take 15 mg by mouth at bedtime.      Marland Kitchen RANITIDINE HCL 150 MG PO CAPS Oral Take 450 mg by mouth 3 (three) times daily.    Marland Kitchen RISPERIDONE 2 MG PO TABS Oral Take 2-4 mg by mouth 2 (two) times daily. Patient takes 1 tablet(2mg ) in the morning and 2 tablets(4mg ) at bedtime    . ACETAMINOPHEN-CODEINE #3 300-30 MG PO TABS Oral Take 1-2 tablets by mouth every 6 (six) hours as needed for pain. 15 tablet 0  . PREDNISONE 20 MG PO TABS Oral Take 2 tablets (40 mg total) by mouth daily. 14 tablet 0  . PROMETHAZINE HCL 25 MG PO TABS Oral Take 1 tablet (25 mg total) by mouth every 6 (six) hours as needed for nausea. 15 tablet 0  . PROMETHAZINE HCL 25 MG PO TABS Oral Take 1 tablet (25 mg total) by mouth every 6 (  six) hours as needed for nausea. 20 tablet 0    BP 130/98  Pulse 101  Temp 98.3 F (36.8 C) (Oral)  Resp 18  SpO2 98%  Physical Exam  Nursing note and vitals reviewed. Constitutional: He is oriented to person, place, and time. He appears well-developed and well-nourished.  HENT:  Head: Normocephalic.  Eyes: EOM are normal. No scleral icterus.       Abnormal, nonreactive left pupil  Neck: Normal range of motion. Neck supple.  Cardiovascular: Regular rhythm.   No murmur heard. Pulmonary/Chest: Effort normal. No respiratory distress.  Abdominal: Soft.  Musculoskeletal: He exhibits no edema and no tenderness.       Lumbar back: He exhibits tenderness. He exhibits normal range of motion, no deformity, no laceration, no pain, no spasm and normal pulse.  Neurological: He is alert and oriented to person, place, and  time. He has normal reflexes. He exhibits normal muscle tone. Gait normal. GCS eye subscore is 4. GCS verbal subscore is 5. GCS motor subscore is 6.  Skin: Skin is warm, dry and intact. He is not diaphoretic.       ED Course  Procedures (including critical care time)  Labs Reviewed  CBC WITH DIFFERENTIAL - Abnormal; Notable for the following:    WBC 10.9 (*)     RBC 5.88 (*)     Hemoglobin 19.1 (*)     MCHC 36.8 (*)     All other components within normal limits  COMPREHENSIVE METABOLIC PANEL - Abnormal; Notable for the following:    Glucose, Bld 131 (*)     AST 62 (*)  HEMOLYSIS AT THIS LEVEL MAY AFFECT RESULT   ALT 129 (*)     All other components within normal limits   Ct Head Wo Contrast  01/20/2012  *RADIOLOGY REPORT*  Clinical Data:  Fall, headache.  Neck pain.  CT HEAD WITHOUT CONTRAST CT CERVICAL SPINE WITHOUT CONTRAST  Technique:  Multidetector CT imaging of the head and cervical spine was performed following the standard protocol without intravenous contrast.  Multiplanar CT image reconstructions of the cervical spine were also generated.  Comparison:  10/17/2011 plain films of the cervical spine.  CT HEAD  Findings: No acute intracranial abnormality.  Specifically, no hemorrhage, hydrocephalus, mass lesion, acute infarction, or significant intracranial injury.  No acute calvarial abnormality.  Visualized paranasal sinuses and mastoids clear.  Orbital soft tissues unremarkable.  IMPRESSION: No acute intracranial abnormality.  CT CERVICAL SPINE  Findings: Normal alignment.  Disc spaces are maintained.  Loss of normal cervical lordosis.  Prevertebral soft tissues are normal. No fracture.  No epidural or paraspinal hematoma.  IMPRESSION: Cervical straightening.  No bony abnormality.  Original Report Authenticated By: Cyndie Chime, M.D.   Ct Cervical Spine Wo Contrast  01/20/2012  *RADIOLOGY REPORT*  Clinical Data:  Fall, headache.  Neck pain.  CT HEAD WITHOUT CONTRAST CT CERVICAL  SPINE WITHOUT CONTRAST  Technique:  Multidetector CT imaging of the head and cervical spine was performed following the standard protocol without intravenous contrast.  Multiplanar CT image reconstructions of the cervical spine were also generated.  Comparison:  10/17/2011 plain films of the cervical spine.  CT HEAD  Findings: No acute intracranial abnormality.  Specifically, no hemorrhage, hydrocephalus, mass lesion, acute infarction, or significant intracranial injury.  No acute calvarial abnormality.  Visualized paranasal sinuses and mastoids clear.  Orbital soft tissues unremarkable.  IMPRESSION: No acute intracranial abnormality.  CT CERVICAL SPINE  Findings: Normal alignment.  Disc spaces  are maintained.  Loss of normal cervical lordosis.  Prevertebral soft tissues are normal. No fracture.  No epidural or paraspinal hematoma.  IMPRESSION: Cervical straightening.  No bony abnormality.  Original Report Authenticated By: Cyndie Chime, M.D.     1. Headache   2. Chronic back pain   3. Elevated LFTs       MDM  No focal neurologic deficits are present. Patient's vital signs are stable. Heart rate is improved after IM Toradol. I reviewed the patient's CT scans which show no acute injury. There is no tumor, hemorrhage, fracture. I discussed these findings with the patient. His headache seems to have features of migraine, however given his symptoms began after injuring his head, he may have postconcussion syndrome. Also given his skin lesion in his elevated LFTs, I stressed the importance of following up with his primary care physician and possible referral to a hepatologist. I will prescribe steroids for his back issues as well as some codeine for pain and by mouth Phenergan which may help with migraine-like headaches. Patient is agreeable with plan. Patient is discharged for further outpatient evaluation in stable condition.        Gavin Pound. Oletta Lamas, MD 01/21/12 [phone]

## 2012-02-26 ENCOUNTER — Emergency Department (HOSPITAL_COMMUNITY): Payer: Self-pay

## 2012-02-26 ENCOUNTER — Emergency Department (HOSPITAL_COMMUNITY)
Admission: EM | Admit: 2012-02-26 | Discharge: 2012-02-26 | Disposition: A | Discharge status: Home / Self Care | Payer: Self-pay | Attending: Emergency Medicine | Admitting: Emergency Medicine

## 2012-02-26 ENCOUNTER — Encounter (HOSPITAL_COMMUNITY): Payer: Self-pay | Admitting: Emergency Medicine

## 2012-02-26 DIAGNOSIS — M545 Low back pain, unspecified: Secondary | ICD-10-CM | POA: Insufficient documentation

## 2012-02-26 DIAGNOSIS — M549 Dorsalgia, unspecified: Secondary | ICD-10-CM

## 2012-02-26 DIAGNOSIS — K219 Gastro-esophageal reflux disease without esophagitis: Secondary | ICD-10-CM | POA: Insufficient documentation

## 2012-02-26 DIAGNOSIS — F319 Bipolar disorder, unspecified: Secondary | ICD-10-CM | POA: Insufficient documentation

## 2012-02-26 DIAGNOSIS — F419 Anxiety disorder, unspecified: Secondary | ICD-10-CM

## 2012-02-26 DIAGNOSIS — F17200 Nicotine dependence, unspecified, uncomplicated: Secondary | ICD-10-CM | POA: Insufficient documentation

## 2012-02-26 DIAGNOSIS — F411 Generalized anxiety disorder: Secondary | ICD-10-CM | POA: Insufficient documentation

## 2012-02-26 DIAGNOSIS — R911 Solitary pulmonary nodule: Secondary | ICD-10-CM

## 2012-02-26 DIAGNOSIS — Z79899 Other long term (current) drug therapy: Secondary | ICD-10-CM | POA: Insufficient documentation

## 2012-02-26 DIAGNOSIS — R0602 Shortness of breath: Secondary | ICD-10-CM | POA: Insufficient documentation

## 2012-02-26 DIAGNOSIS — R0789 Other chest pain: Secondary | ICD-10-CM | POA: Insufficient documentation

## 2012-02-26 HISTORY — DX: Bipolar disorder, unspecified: F31.9

## 2012-02-26 LAB — POCT I-STAT TROPONIN I: Troponin i, poc: 0 ng/mL (ref 0.00–0.08)

## 2012-02-26 LAB — BASIC METABOLIC PANEL
BUN: 12 mg/dL (ref 6–23)
CO2: 21 mEq/L (ref 19–32)
GFR calc non Af Amer: >90 mL/min (ref >90)
Glucose, Bld: 114 mg/dL — ABNORMAL HIGH (ref 70–99)
Potassium: 3.8 mEq/L (ref 3.5–5.1)

## 2012-02-26 LAB — CBC
HCT: 48.7 % (ref 39.0–52.0)
Hemoglobin: 17.7 g/dL — ABNORMAL HIGH (ref 13.0–17.0)
MCH: 32.1 pg (ref 26.0–34.0)
MCHC: 36.3 g/dL — ABNORMAL HIGH (ref 30.0–36.0)
RBC: 5.51 MIL/uL (ref 4.22–5.81)

## 2012-02-26 LAB — D-DIMER, QUANTITATIVE: D-Dimer, Quant: 0.27 ug/mL-FEU (ref 0.00–0.48)

## 2012-02-26 MED ORDER — METHOCARBAMOL 500 MG PO TABS: 500.0000 mg | ORAL_TABLET | Freq: Two times a day (BID) | ORAL | Status: AC

## 2012-02-26 MED ORDER — HYDROCODONE-ACETAMINOPHEN 5-500 MG PO TABS: 1.0000 | ORAL_TABLET | Freq: Four times a day (QID) | ORAL | Status: AC | PRN

## 2012-02-26 MED ORDER — IOHEXOL 350 MG/ML SOLN
100.0000 mL | Freq: Once | INTRAVENOUS | Status: AC | PRN
  Administered 2012-02-26: 100 mL via INTRAVENOUS

## 2012-02-26 NOTE — ED Notes (Addendum)
Reports picked dog up that's 75 lbs and reports unable to lift >20 lbs; now reports lower back pain and pain between shoulder blades; pt noted to be tachy in 140's; pt does report feeling like his heart is racing and reports for past 4 days  Has been having episodes of intermittent chest tightness, diaphoresis, pale, nauseous, lightheaded--pt reports that he did have episode today like that but no symptoms currently; reports hx of panic attacks but reports that these episodes are different than normal panic attack; pt does report disc problems in lower back but not aware of any problems in upper back

## 2012-02-26 NOTE — ED Provider Notes (Signed)
History     CSN: [id number]  Arrival date & time 02/26/12  0103   First MD Initiated Contact with Patient 02/26/12 0134      Chief Complaint  Patient presents with  . Back Pain    (Consider location/radiation/quality/duration/timing/severity/associated sxs/prior treatment) Patient is a 36 y.o. male presenting with back pain. The history is provided by the patient.  Back Pain  This is a recurrent problem. The current episode started less than 1 hour ago. The problem occurs constantly. The problem has not changed since onset.Associated with: lifted a 70 pound dog. The pain is present in the thoracic spine. The quality of the pain is described as stabbing. The pain is at a severity of 10/10. The pain is severe. The symptoms are aggravated by bending. The pain is the same all the time. Pertinent negatives include no fever, no numbness, no weight loss, no headaches, no abdominal pain, no abdominal swelling, no bowel incontinence, no perianal numbness, no bladder incontinence, no dysuria, no pelvic pain, no leg pain, no paresthesias, no paresis, no tingling and no weakness. Associated symptoms comments: Radiates around B. He has tried nothing for the symptoms. The treatment provided no relief.    Past Medical History  Diagnosis Date  . Depression   . Anxiety   . Chronic back pain   . Reflux   . Bipolar 1 disorder     Past Surgical History  Procedure Date  . Appendectomy     Family History  Problem Relation Age of Onset  . Diabetes Mother   . Diabetes Father   . Asthma Sister     History  Substance Use Topics  . Smoking status: Current Everyday Smoker -- 0.5 packs/day    Types: Cigarettes  . Smokeless tobacco: Not on file  . Alcohol Use: No      Review of Systems  Constitutional: Negative for fever, weight loss and diaphoresis.  Respiratory: Negative for shortness of breath.   Gastrointestinal: Negative for abdominal pain and bowel incontinence.  Genitourinary: Negative  for bladder incontinence, dysuria and pelvic pain.  Musculoskeletal: Positive for back pain.  Neurological: Negative for tingling, weakness, numbness, headaches and paresthesias.  All other systems reviewed and are negative.    Allergies  Review of patient's allergies indicates no known allergies.  Home Medications   Current Outpatient Rx  Name Route Sig Dispense Refill  . DIAZEPAM 10 MG PO TABS Oral Take 10 mg by mouth 4 (four) times daily. anxiety    . DIVALPROEX SODIUM 250 MG PO TBEC Oral Take 1,000 mg by mouth at bedtime.     Marland Kitchen LORAZEPAM 1 MG PO TABS Oral Take 1 mg by mouth 3 (three) times daily.    Marland Kitchen MIRTAZAPINE 15 MG PO TABS Oral Take 15 mg by mouth at bedtime.      Marland Kitchen RANITIDINE HCL 150 MG PO CAPS Oral Take 450 mg by mouth 3 (three) times daily.    Marland Kitchen RISPERIDONE 2 MG PO TABS Oral Take 2-4 mg by mouth 2 (two) times daily. Patient takes 1 tablet(2mg ) in the morning and 2 tablets(4mg ) at bedtime    . PROMETHAZINE HCL 25 MG PO TABS Oral Take 1 tablet (25 mg total) by mouth every 6 (six) hours as needed for nausea. 15 tablet 0  . PROMETHAZINE HCL 25 MG PO TABS Oral Take 1 tablet (25 mg total) by mouth every 6 (six) hours as needed for nausea. 20 tablet 0    BP 124/83  Pulse 103  Temp 97.4 F (36.3 C) (Oral)  Resp 14  Ht 6\' 1"  (1.854 m)  Wt 241 lb (109.317 kg)  BMI 31.80 kg/m2  SpO2 96%  Physical Exam  Constitutional: He is oriented to person, place, and time. He appears well-developed and well-nourished.  HENT:  Head: Normocephalic and atraumatic.  Mouth/Throat: Oropharynx is clear and moist.  Eyes: Conjunctivae are normal. Pupils are equal, round, and reactive to light.  Neck: Normal range of motion. Neck supple.  Cardiovascular: Normal rate and regular rhythm.   Pulmonary/Chest: Effort normal and breath sounds normal. He has no wheezes. He has no rales.  Abdominal: Soft. Bowel sounds are normal. There is no tenderness. There is no rebound and no guarding.    Musculoskeletal: Normal range of motion.  Neurological: He is alert and oriented to person, place, and time. He has normal reflexes.  Skin: Skin is warm and dry. He is not diaphoretic.  Psychiatric: His mood appears anxious.    ED Course  Procedures (including critical care time)  Labs Reviewed  CBC - Abnormal; Notable for the following:    Hemoglobin 17.7 (*)     MCHC 36.3 (*)     All other components within normal limits  BASIC METABOLIC PANEL - Abnormal; Notable for the following:    Glucose, Bld 114 (*)     All other components within normal limits  POCT I-STAT TROPONIN I  POCT I-STAT TROPONIN I   Dg Chest 2 View  02/26/2012  *RADIOLOGY REPORT*  Clinical Data: Chest pressure, shortness of breath  CHEST - 2 VIEW  Comparison:  04/25/2010  Findings:  The heart size and mediastinal contours are within normal limits.  Both lungs are clear.  The visualized skeletal structures are unremarkable.  IMPRESSION: No active cardiopulmonary disease.  Original Report Authenticated By: Judie Petit. Ruel Favors, M.D.   Dg Lumbar Spine Complete  02/26/2012  *RADIOLOGY REPORT*  Clinical Data: Low back pain after lifting heavy dog.  LUMBAR SPINE - COMPLETE 4+ VIEW  Comparison: 10/17/2011.  Findings: Five lumbar type vertebra.  Normal alignment of the lumbar vertebra and facet joints.  No vertebral compression deformities.  Intervertebral disc space heights are preserved.  No focal bone lesion or bone destruction.  Bone cortex and trabecular architecture appear intact.  No significant change since previous study.  Incidental note of contrast material in the renal collecting systems after previous CT scan.  IMPRESSION: No displaced fractures identified.  Original Report Authenticated By: Marlon Pel, M.D.   Ct Angio Chest Pe W/cm &/or Wo Cm  02/26/2012  *RADIOLOGY REPORT*  Clinical Data: Low back pain and pain between shoulders after lifting a dog.  Diaphoresis.  Nausea.  Pale. Smoker.  CT ANGIOGRAPHY CHEST   Technique:  Multidetector CT imaging of the chest using the standard protocol during bolus administration of intravenous contrast. Multiplanar reconstructed images including MIPs were obtained and reviewed to evaluate the vascular anatomy.  Contrast: OMNIPAQUE IOHEXOL 350 MG/ML SOLN  Comparison: None.  Findings: Unenhanced images demonstrate normal caliber thoracic aorta.  No significant calcification.  No evidence of intramural hematoma.  Incidental note of fatty infiltration of the liver.  Images obtained after contrast administration demonstrate normal caliber thoracic aorta without aneurysm.  There is motion artifact in the ascending aorta.  No evidence of dissection.  Normal heart size and pulmonary vascularity.  Mediastinal lymph nodes are not pathologically enlarged.  Esophagus is mostly decompressed.  Small esophageal hiatal hernia.  No pleural effusions.  Mild dependent atelectasis in  the lungs.  No significant airspace consolidation or interstitial change.  Airways appear patent.  Minimal emphysematous changes in the apices.  No pneumothorax.  Scattered small nodules in the right lung, largest measuring up to 4 mm.  Given risk factors for bronchogenic carcinoma, follow-up chest CT at 1 year is recommended.  This recommendation follows the consensus statement:  Guidelines for Management of Small Pulmonary Nodules Detected on CT Scans:  A Statement from the Fleischner Society as published in Radiology 2005; 237:395-400.  Diffuse fatty infiltration of the liver.  IMPRESSION: No evidence of aortic dissection.  Small nodules in the right lung. 1-year follow-up recommended as discussed.  Original Report Authenticated By: Marlon Pel, M.D.     No diagnosis found.    MDM   Date: 02/26/2012  Rate: 132  Rhythm: sinus tachycardia  QRS Axis: right  Intervals: normal  ST/T Wave abnormalities: nonspecific ST changes  Conduction Disutrbances:none  Narrative Interpretation:   Old EKG  Reviewed: changes noted   HR and sats improved.  Suspect anxiety with superimposed mechanical back pain.  Negative cardiac troponins x 2 and negative dimer and scan.  Patient informed of  Pulmonary nodule and need for repeat chest CT within 1 year.  Printed on discharge papers.  Patient verbalized understanding and agreed follow up with PMD        Giovani Neumeister K Gerard Bonus-Rasch, MD 02/26/12 [phone]

## 2012-02-26 NOTE — ED Notes (Signed)
Patient states he began hurting between his shoulder blades after picking up a 75 lb dog.  Has chronic lower back pain and is not to lift more than 20 lbs.  States it hurts when he takes a deep breath and sometimes his chest hurts but that comes and goes.

## 2012-03-25 ENCOUNTER — Encounter (HOSPITAL_COMMUNITY): Payer: Self-pay | Admitting: *Deleted

## 2012-03-25 ENCOUNTER — Emergency Department (HOSPITAL_COMMUNITY): Payer: Self-pay

## 2012-03-25 ENCOUNTER — Emergency Department (HOSPITAL_COMMUNITY)
Admission: EM | Admit: 2012-03-25 | Discharge: 2012-03-26 | Disposition: A | Discharge status: Home / Self Care | Payer: Self-pay | Attending: Emergency Medicine | Admitting: Emergency Medicine

## 2012-03-25 DIAGNOSIS — F319 Bipolar disorder, unspecified: Secondary | ICD-10-CM | POA: Insufficient documentation

## 2012-03-25 DIAGNOSIS — R079 Chest pain, unspecified: Secondary | ICD-10-CM | POA: Insufficient documentation

## 2012-03-25 DIAGNOSIS — F17200 Nicotine dependence, unspecified, uncomplicated: Secondary | ICD-10-CM | POA: Insufficient documentation

## 2012-03-25 DIAGNOSIS — K2970 Gastritis, unspecified, without bleeding: Secondary | ICD-10-CM

## 2012-03-25 DIAGNOSIS — K219 Gastro-esophageal reflux disease without esophagitis: Secondary | ICD-10-CM | POA: Insufficient documentation

## 2012-03-25 DIAGNOSIS — E860 Dehydration: Secondary | ICD-10-CM

## 2012-03-25 LAB — CBC WITH DIFFERENTIAL/PLATELET
Basophils Absolute: 0 10*3/uL (ref 0.0–0.1)
Lymphocytes Relative: 18 % (ref 12–46)
Lymphs Abs: 1.8 10*3/uL (ref 0.7–4.0)
MCV: 88.4 fL (ref 78.0–100.0)
Neutro Abs: 7.7 10*3/uL (ref 1.7–7.7)
Neutrophils Relative %: 77 % (ref 43–77)
Platelets: 234 10*3/uL (ref 150–400)
RBC: 6.02 MIL/uL — ABNORMAL HIGH (ref 4.22–5.81)
RDW: 12.6 % (ref 11.5–15.5)
WBC: 10 10*3/uL (ref 4.0–10.5)

## 2012-03-25 LAB — COMPREHENSIVE METABOLIC PANEL
ALT: 124 U/L — ABNORMAL HIGH (ref 0–53)
AST: 52 U/L — ABNORMAL HIGH (ref 0–37)
Alkaline Phosphatase: 120 U/L — ABNORMAL HIGH (ref 39–117)
CO2: 25 mEq/L (ref 19–32)
Chloride: 100 mEq/L (ref 96–112)
GFR calc Af Amer: >90 mL/min (ref >90)
GFR calc non Af Amer: 80 mL/min — ABNORMAL LOW (ref >90)
Glucose, Bld: 97 mg/dL (ref 70–99)
Potassium: 3.4 mEq/L — ABNORMAL LOW (ref 3.5–5.1)
Sodium: 139 mEq/L (ref 135–145)

## 2012-03-25 MED ORDER — SODIUM CHLORIDE 0.9 % IV BOLUS (SEPSIS)
1000.0000 mL | Freq: Once | INTRAVENOUS | Status: AC
  Administered 2012-03-25: 1000 mL via INTRAVENOUS

## 2012-03-25 MED ORDER — HYDROMORPHONE HCL PF 1 MG/ML IJ SOLN
1.0000 mg | Freq: Once | INTRAMUSCULAR | Status: AC
  Administered 2012-03-25: 1 mg via INTRAVENOUS
  Filled 2012-03-25: qty 1

## 2012-03-25 MED ORDER — ONDANSETRON HCL 4 MG/2ML IJ SOLN
4.0000 mg | Freq: Once | INTRAMUSCULAR | Status: AC
  Administered 2012-03-25: 4 mg via INTRAVENOUS
  Filled 2012-03-25: qty 2

## 2012-03-25 MED ORDER — PANTOPRAZOLE SODIUM 40 MG IV SOLR
40.0000 mg | Freq: Once | INTRAVENOUS | Status: AC
  Administered 2012-03-25: 40 mg via INTRAVENOUS
  Filled 2012-03-25: qty 40

## 2012-03-25 MED ORDER — SUCRALFATE 1 G PO TABS: 1.0000 g | ORAL_TABLET | Freq: Four times a day (QID) | ORAL | Status: DC

## 2012-03-25 NOTE — ED Notes (Signed)
Cp to mid chest for last 4-5 hours

## 2012-03-25 NOTE — ED Provider Notes (Signed)
History  This chart was scribed for Alec Lennert, MD by Ladona Ridgel Day. This patient was seen in room [facility]12/[facility]12 and the patient's care was started at 2046.   CSN: [id number]  Arrival date & time 03/25/12  2046   First MD Initiated Contact with Patient 03/25/12 2055      Chief Complaint  Patient presents with  . Chest Pain   Patient is a 36 y.o. male presenting with vomiting. The history is provided by the patient. No language interpreter was used.  Emesis  This is a new problem. The current episode started 3 to 5 hours ago. The problem occurs 2 to 4 times per day. The problem has been resolved. The emesis has an appearance of stomach contents. There has been no fever. Associated symptoms include abdominal pain. Pertinent negatives include no cough, no diarrhea and no fever.   Alec Carter is a 36 y.o. male who presents to the Emergency Department complaining of back and chest pain today which began this AM while he was doing outside work at his house. He states that he was not drinking any fluids while he was working outside. He states that he tried drinking fluids after resting and had a sub-sandwich for lunch this PM but shortly after had X4 emesis episodes with stomach contents. He states that he is now having some diffuse abdominal discomfort and just does not feel well.    Past Medical History  Diagnosis Date  . Depression   . Anxiety   . Chronic back pain   . Reflux   . Bipolar 1 disorder     Past Surgical History  Procedure Date  . Appendectomy     Family History  Problem Relation Age of Onset  . Diabetes Mother   . Diabetes Father   . Asthma Sister     History  Substance Use Topics  . Smoking status: Current Everyday Smoker -- 0.5 packs/day    Types: Cigarettes  . Smokeless tobacco: Not on file  . Alcohol Use: No      Review of Systems  Constitutional: Negative for fever.  HENT: Negative for congestion, sinus pressure and ear discharge.   Eyes: Negative  for discharge.  Respiratory: Negative for cough.   Cardiovascular: Positive for chest pain.  Gastrointestinal: Positive for nausea, vomiting and abdominal pain. Negative for diarrhea.  Genitourinary: Negative for frequency and hematuria.  Musculoskeletal: Positive for back pain.  Skin: Negative for rash.  Hematological: Negative.   Psychiatric/Behavioral: Negative for hallucinations.  All other systems reviewed and are negative.    Allergies  Review of patient's allergies indicates no known allergies.  Home Medications   Current Outpatient Rx  Name Route Sig Dispense Refill  . DIAZEPAM 10 MG PO TABS Oral Take 10 mg by mouth 4 (four) times daily. anxiety    . DIVALPROEX SODIUM 250 MG PO TBEC Oral Take 1,000 mg by mouth at bedtime.     Marland Kitchen LORAZEPAM 1 MG PO TABS Oral Take 1 mg by mouth 3 (three) times daily.    Marland Kitchen MIRTAZAPINE 15 MG PO TABS Oral Take 15 mg by mouth at bedtime.      Marland Kitchen RANITIDINE HCL 150 MG PO CAPS Oral Take 450 mg by mouth 3 (three) times daily.    Marland Kitchen RISPERIDONE 2 MG PO TABS Oral Take 2-4 mg by mouth 2 (two) times daily. Patient takes 1 tablet(2mg ) in the morning and 2 tablets(4mg ) at bedtime      Triage Vitals: BP 134/94  Pulse 130  Temp 97.6 F (36.4 C) (Oral)  Resp 20  Ht 6' (1.829 m)  Wt 240 lb (108.863 kg)  BMI 32.55 kg/m2  SpO2 99%  Physical Exam  Nursing note and vitals reviewed. Constitutional: He is oriented to person, place, and time. He appears well-developed.  HENT:  Head: Normocephalic and atraumatic.  Eyes: Conjunctivae and EOM are normal. No scleral icterus.  Neck: Neck supple. No thyromegaly present.  Cardiovascular: Normal rate and regular rhythm.  Exam reveals no gallop and no friction rub.   No murmur heard. Pulmonary/Chest: No stridor. He has no wheezes. He has no rales. He exhibits no tenderness.  Abdominal: He exhibits no distension. There is no tenderness. There is no rebound.  Musculoskeletal: Normal range of motion. He exhibits no  edema.  Lymphadenopathy:    He has no cervical adenopathy.  Neurological: He is oriented to person, place, and time. Coordination normal.  Skin: No rash noted. No erythema.  Psychiatric: He has a normal mood and affect. His behavior is normal.    ED Course  Procedures (including critical care time) DIAGNOSTIC STUDIES: Oxygen Saturation is 99% on room air, normal by my interpretation.    COORDINATION OF CARE: At 910 PM Discussed treatment plan with patient which includes protonix, pain/nausea medicine, IV fluids, abdominal X-ray, blood work, and EKG. Patient agrees.    Labs Reviewed  CBC WITH DIFFERENTIAL  COMPREHENSIVE METABOLIC PANEL  LIPASE, BLOOD   No results found.   No diagnosis found.    MDM  The chart was scribed for me under my direct supervision.  I personally performed the history, physical, and medical decision making and all procedures in the evaluation of this patient.Alec Lennert, MD 03/25/12 2330

## 2012-03-26 NOTE — ED Notes (Signed)
Patient to be discharged. Patient states he does not have a ride home. States "I don't have anyone I can call and I have the only vehicle at home so I have to drive." Advised patient he could not drive home due to narcotic administration. Patient verbalized understanding. Patient requested to remain in room until discharge if possible. Patient currently sitting in bed watching TV at this time. No obvious distress noted. Patient alert and oriented at this time.

## 2012-03-26 NOTE — ED Notes (Signed)
Patient ambulatory without assistance at discharge. Gait steady. No obvious distress noted.

## 2012-04-08 ENCOUNTER — Inpatient Hospital Stay (HOSPITAL_COMMUNITY)
Admission: EM | Admit: 2012-04-08 | Discharge: 2012-04-10 | DRG: 309 | Disposition: A | Discharge status: Home / Self Care | Payer: MEDICAID | Attending: Internal Medicine | Admitting: Internal Medicine

## 2012-04-08 ENCOUNTER — Encounter (HOSPITAL_COMMUNITY): Payer: Self-pay | Admitting: Radiation Oncology

## 2012-04-08 ENCOUNTER — Emergency Department (HOSPITAL_COMMUNITY): Payer: Self-pay

## 2012-04-08 DIAGNOSIS — M549 Dorsalgia, unspecified: Secondary | ICD-10-CM | POA: Diagnosis present

## 2012-04-08 DIAGNOSIS — R000 Tachycardia, unspecified: Secondary | ICD-10-CM

## 2012-04-08 DIAGNOSIS — F3132 Bipolar disorder, current episode depressed, moderate: Secondary | ICD-10-CM

## 2012-04-08 DIAGNOSIS — Z833 Family history of diabetes mellitus: Secondary | ICD-10-CM

## 2012-04-08 DIAGNOSIS — K219 Gastro-esophageal reflux disease without esophagitis: Secondary | ICD-10-CM | POA: Diagnosis present

## 2012-04-08 DIAGNOSIS — I456 Pre-excitation syndrome: Secondary | ICD-10-CM | POA: Diagnosis present

## 2012-04-08 DIAGNOSIS — R918 Other nonspecific abnormal finding of lung field: Secondary | ICD-10-CM

## 2012-04-08 DIAGNOSIS — R748 Abnormal levels of other serum enzymes: Secondary | ICD-10-CM | POA: Diagnosis present

## 2012-04-08 DIAGNOSIS — R079 Chest pain, unspecified: Secondary | ICD-10-CM

## 2012-04-08 DIAGNOSIS — M25519 Pain in unspecified shoulder: Secondary | ICD-10-CM

## 2012-04-08 DIAGNOSIS — Z825 Family history of asthma and other chronic lower respiratory diseases: Secondary | ICD-10-CM

## 2012-04-08 DIAGNOSIS — F419 Anxiety disorder, unspecified: Secondary | ICD-10-CM | POA: Diagnosis present

## 2012-04-08 DIAGNOSIS — M25512 Pain in left shoulder: Secondary | ICD-10-CM | POA: Diagnosis present

## 2012-04-08 DIAGNOSIS — D45 Polycythemia vera: Secondary | ICD-10-CM

## 2012-04-08 DIAGNOSIS — R231 Pallor: Secondary | ICD-10-CM

## 2012-04-08 DIAGNOSIS — R911 Solitary pulmonary nodule: Secondary | ICD-10-CM | POA: Diagnosis present

## 2012-04-08 DIAGNOSIS — D751 Secondary polycythemia: Secondary | ICD-10-CM

## 2012-04-08 DIAGNOSIS — F411 Generalized anxiety disorder: Secondary | ICD-10-CM

## 2012-04-08 DIAGNOSIS — F17200 Nicotine dependence, unspecified, uncomplicated: Secondary | ICD-10-CM | POA: Diagnosis present

## 2012-04-08 LAB — CBC WITH DIFFERENTIAL/PLATELET
Basophils Absolute: 0 10*3/uL (ref 0.0–0.1)
HCT: 49.4 % (ref 39.0–52.0)
Hemoglobin: 18.1 g/dL — ABNORMAL HIGH (ref 13.0–17.0)
Lymphocytes Relative: 29 % (ref 12–46)
Lymphs Abs: 2.1 10*3/uL (ref 0.7–4.0)
MCV: 88.7 fL (ref 78.0–100.0)
Monocytes Absolute: 0.4 10*3/uL (ref 0.1–1.0)
Neutro Abs: 4.4 10*3/uL (ref 1.7–7.7)
RBC: 5.57 MIL/uL (ref 4.22–5.81)
RDW: 12.6 % (ref 11.5–15.5)
WBC: 7.2 10*3/uL (ref 4.0–10.5)

## 2012-04-08 LAB — CBC
Platelets: 168 10*3/uL (ref 150–400)
RDW: 12.6 % (ref 11.5–15.5)
WBC: 9 10*3/uL (ref 4.0–10.5)

## 2012-04-08 LAB — TROPONIN I
Troponin I: <0.3 ng/mL (ref <0.30)
Troponin I: <0.3 ng/mL (ref <0.30)

## 2012-04-08 LAB — URINALYSIS, ROUTINE W REFLEX MICROSCOPIC
Glucose, UA: NEGATIVE mg/dL
Leukocytes, UA: NEGATIVE
Nitrite: NEGATIVE
Protein, ur: NEGATIVE mg/dL
Urobilinogen, UA: 0.2 mg/dL (ref 0.0–1.0)

## 2012-04-08 LAB — BLOOD GAS, ARTERIAL
Acid-base deficit: 1.1 mmol/L (ref 0.0–2.0)
Bicarbonate: 22.7 mEq/L (ref 20.0–24.0)
Patient temperature: 98.6
TCO2: 23.8 mmol/L (ref 0–100)
pH, Arterial: 7.422 (ref 7.350–7.450)

## 2012-04-08 LAB — URINALYSIS, MICROSCOPIC ONLY
Glucose, UA: NEGATIVE mg/dL
Hgb urine dipstick: NEGATIVE
Ketones, ur: 15 mg/dL — AB
Protein, ur: NEGATIVE mg/dL

## 2012-04-08 LAB — COMPREHENSIVE METABOLIC PANEL
ALT: 133 U/L — ABNORMAL HIGH (ref 0–53)
AST: 64 U/L — ABNORMAL HIGH (ref 0–37)
CO2: 22 mEq/L (ref 19–32)
Chloride: 103 mEq/L (ref 96–112)
Creatinine, Ser: 0.88 mg/dL (ref 0.50–1.35)
GFR calc Af Amer: >90 mL/min (ref >90)
GFR calc non Af Amer: >90 mL/min (ref >90)
Glucose, Bld: 108 mg/dL — ABNORMAL HIGH (ref 70–99)
Total Bilirubin: 0.2 mg/dL — ABNORMAL LOW (ref 0.3–1.2)

## 2012-04-08 LAB — RAPID URINE DRUG SCREEN, HOSP PERFORMED
Amphetamines: NOT DETECTED
Barbiturates: NOT DETECTED

## 2012-04-08 LAB — CK TOTAL AND CKMB (NOT AT ARMC)
Relative Index: INVALID (ref 0.0–2.5)
Relative Index: INVALID (ref 0.0–2.5)

## 2012-04-08 LAB — RETICULOCYTES
RBC.: 5.12 MIL/uL (ref 4.22–5.81)
Retic Count, Absolute: 56.3 10*3/uL (ref 19.0–186.0)

## 2012-04-08 MED ORDER — LORAZEPAM 1 MG PO TABS
1.0000 mg | ORAL_TABLET | Freq: Three times a day (TID) | ORAL | Status: DC
  Administered 2012-04-08 – 2012-04-10 (×5): 1 mg via ORAL
  Filled 2012-04-08 (×5): qty 1

## 2012-04-08 MED ORDER — ACETAMINOPHEN 650 MG RE SUPP: 650.0000 mg | Freq: Four times a day (QID) | RECTAL | Status: DC | PRN

## 2012-04-08 MED ORDER — DIAZEPAM 5 MG PO TABS: 10.0000 mg | ORAL_TABLET | Freq: Four times a day (QID) | ORAL | Status: DC | PRN

## 2012-04-08 MED ORDER — ENOXAPARIN SODIUM 40 MG/0.4ML ~~LOC~~ SOLN
40.0000 mg | SUBCUTANEOUS | Status: DC
  Administered 2012-04-08 – 2012-04-09 (×2): 40 mg via SUBCUTANEOUS
  Filled 2012-04-08 (×3): qty 0.4

## 2012-04-08 MED ORDER — ACETAMINOPHEN 325 MG PO TABS
650.0000 mg | ORAL_TABLET | Freq: Four times a day (QID) | ORAL | Status: DC | PRN
  Administered 2012-04-08 – 2012-04-10 (×3): 650 mg via ORAL
  Filled 2012-04-08 (×4): qty 2

## 2012-04-08 MED ORDER — OXYCODONE-ACETAMINOPHEN 5-325 MG PO TABS
1.0000 | ORAL_TABLET | Freq: Four times a day (QID) | ORAL | Status: AC | PRN
  Administered 2012-04-08 – 2012-04-09 (×3): 1 via ORAL
  Filled 2012-04-08 (×3): qty 1

## 2012-04-08 MED ORDER — RISPERIDONE 2 MG PO TABS
2.0000 mg | ORAL_TABLET | Freq: Every morning | ORAL | Status: DC
  Administered 2012-04-09 – 2012-04-10 (×2): 2 mg via ORAL
  Filled 2012-04-08 (×2): qty 1

## 2012-04-08 MED ORDER — PANTOPRAZOLE SODIUM 40 MG PO TBEC
40.0000 mg | DELAYED_RELEASE_TABLET | Freq: Every day | ORAL | Status: DC
  Administered 2012-04-09 – 2012-04-10 (×2): 40 mg via ORAL
  Filled 2012-04-08 (×3): qty 1

## 2012-04-08 MED ORDER — LORAZEPAM 2 MG/ML IJ SOLN
1.0000 mg | Freq: Once | INTRAMUSCULAR | Status: AC
  Administered 2012-04-08: 1 mg via INTRAVENOUS
  Filled 2012-04-08: qty 1

## 2012-04-08 MED ORDER — DIVALPROEX SODIUM 500 MG PO DR TAB
1000.0000 mg | DELAYED_RELEASE_TABLET | Freq: Every day | ORAL | Status: DC
  Administered 2012-04-08 – 2012-04-09 (×2): 1000 mg via ORAL
  Filled 2012-04-08 (×3): qty 2

## 2012-04-08 MED ORDER — SODIUM CHLORIDE 0.9 % IV BOLUS (SEPSIS)
1000.0000 mL | Freq: Once | INTRAVENOUS | Status: AC
  Administered 2012-04-08: 1000 mL via INTRAVENOUS

## 2012-04-08 MED ORDER — ONDANSETRON HCL 4 MG/2ML IJ SOLN: 4.0000 mg | Freq: Four times a day (QID) | INTRAMUSCULAR | Status: DC | PRN

## 2012-04-08 MED ORDER — MIRTAZAPINE 15 MG PO TABS
15.0000 mg | ORAL_TABLET | Freq: Every day | ORAL | Status: DC
  Administered 2012-04-08 – 2012-04-09 (×2): 15 mg via ORAL
  Filled 2012-04-08 (×3): qty 1

## 2012-04-08 MED ORDER — RISPERIDONE 2 MG PO TABS: 2.0000 mg | ORAL_TABLET | Freq: Two times a day (BID) | ORAL | Status: DC

## 2012-04-08 MED ORDER — SODIUM CHLORIDE 0.9 % IV SOLN
INTRAVENOUS | Status: DC
Start: 2012-04-08 — End: 2012-04-10
  Administered 2012-04-08: 19:00:00 via INTRAVENOUS

## 2012-04-08 MED ORDER — RISPERIDONE 2 MG PO TABS
4.0000 mg | ORAL_TABLET | Freq: Every day | ORAL | Status: DC
  Administered 2012-04-08 – 2012-04-09 (×2): 4 mg via ORAL
  Filled 2012-04-08 (×3): qty 2

## 2012-04-08 MED ORDER — ONDANSETRON HCL 4 MG PO TABS: 4.0000 mg | ORAL_TABLET | Freq: Four times a day (QID) | ORAL | Status: DC | PRN

## 2012-04-08 NOTE — ED Notes (Signed)
Cp sob and  Feels like heart beating fast

## 2012-04-08 NOTE — ED Notes (Signed)
MD at bedside. 

## 2012-04-08 NOTE — H&P (Signed)
Date: 04/08/2012               Patient Name:  Alec Carter MRN: [id number]  DOB: 10-10-1975 Age / Sex: 36 y.o., male   PCP: Pcp Not In System              Medical Service: Internal Medicine Teaching Service              Attending Physician: Dr. Lars Mage, MD    First Contact: Dr. Elenor Legato Pager: [phone]  Second Contact: Dr. Kristie Cowman Pager: [phone]            After Hours (After 5p/  First Contact Pager: [phone]  weekends / holidays): Second Contact Pager: [phone]     Chief Complaint: Palpitations  History of Present Illness: Patient is a 36 y.o. male with a PMHx of bipolar disorder and chronic pain who presents to [facility] for evaluation of palpitations. Patient states that he has been feeling as if his heart is beating very fast and that this has been occurring constantly for approximately one month. He also states that he was working on his wife's car approximately one week prior to presentation and felt very easily tired at that time, which is unusual for him. He states that both of these issues continued to persist which led to him to seek care at the [facility] ED.   The patient also complains of occasional chest pain and shortness of breath for approximately 1-2 months. The patient describes the chest pain as left-sided, unrelated to activity, and unchanged with position. The shortness of breath occurs throughout the day, and has not changed over the last month. He also complains of cough productive of green sputum as well as scant hemoptysis, which have both been present for approximately 2 months. The patient has chronic nausea and vomiting, and states that for the past two weeks he has vomiting twice per day on average. He believes that the chest pain, as well as the nausea and vomiting, are likely secondary to anxiety.   Alec Carter visited the ED on 03/25/2012 for similar complaints, but workup did not reveal any identifiable cause of his discomfort. Prior to that  presentation, patient was seen in the ED on 02/26/2012 for back pain, but concern was present for pulmonary embolism which resulted in the patient receiving CTA chest. No PE was identified, but patient was found to have multiple small R lung nodules (~50mm) and periaortic lymphadenopathy. No further plans for follow-up were made with the patient at that time.  Review of Systems: Constitutional:  admits to fever, chills, night sweats (x2 months), weight loss (8 lbs in previous week), appetite change and fatigue.  Respiratory: admits to SOB, cough  Cardiovascular: admits to chest pain, palpitations (heart beating fast x 1 month)  Gastrointestinal: admits to nausea, vomiting (chronic, 2x/day), diarrhea  Musculoskeletal: admits to  back pain (bilateral lower back and L upper back)   Skin: admits to rash (lower back rash x1 year)  Neurological: admits to weakness (x1 week)  Psychiatric/ Behavioral: admits to difficulty with sleep (4hrs/night, chronic), anxiety, depression. Denies SI/HI.    Current Outpatient Medications: Medication Sig Dispense Refill  . diazepam (VALIUM) 10 MG tablet Take 10 mg by mouth every 6 (six) hours as needed. anxiety      . divalproex (DEPAKOTE) 250 MG EC tablet Take 1,000 mg by mouth at bedtime.       Marland Kitchen LORazepam (ATIVAN) 1 MG tablet Take 1 mg by  mouth 3 (three) times daily.      . mirtazapine (REMERON) 15 MG tablet Take 15 mg by mouth at bedtime.        . ranitidine (ZANTAC) 150 MG tablet Take 450-600 mg by mouth 3 (three) times daily. Acid reflux      . risperiDONE (RISPERDAL) 2 MG tablet Take 2-4 mg by mouth 2 (two) times daily. Patient takes 1 tablet(2mg ) in the morning and 2 tablets(4mg ) at bedtime      . ranitidine (ZANTAC) 150 MG capsule Take 450 mg by mouth 3 (three) times daily.        Allergies: No Known Allergies   Past Medical History: Past Medical History  Diagnosis Date  . Depression   . Anxiety   . Chronic back pain   . Reflux   . Bipolar 1  disorder     Past Surgical History: Past Surgical History  Procedure Date  . Appendectomy     Family History: Family History  Problem Relation Age of Onset  . Diabetes Mother   . Diabetes Father   . Asthma Sister     Social History: History   Social History  . Marital Status: Married    Spouse Name: N/A    Number of Children: N/A  . Years of Education: N/A   Occupational History  . Not on file.   Social History Main Topics  . Smoking status: Current Every Day Smoker -- 0.5 packs/day    Types: Cigarettes  . Smokeless tobacco: Not on file  . Alcohol Use: No  . Drug Use: No  . Sexually Active:    Other Topics Concern  . Not on file   Social History Narrative  . No narrative on file     Vital Signs: Blood pressure 130/97, pulse 96, temperature 97.5 F (36.4 C), resp. rate 26, SpO2 96.00%.  Physical Exam: General: Vital signs reviewed and noted. Well-developed, well-nourished, in no acute distress; alert, appropriate and cooperative throughout examination.  Head: Normocephalic, atraumatic.  Eyes: Mild anisocoria; pupils reactive. Mild strabismus. No signs of anemia or jaundice.  Nose: Mucous membranes moist, not inflammed, nonerythematous.  Throat: Oropharynx nonerythematous, no exudate appreciated.   Neck: No deformities, masses, or tenderness noted.  Lungs:  Normal respiratory effort. Clear to auscultation BL without crackles or wheezes.  Heart/chest: RRR. S1 and S2 normal without gallop, murmur, or rubs. TTP of left-chest, with pain reproduced in quality and quantity.   Abdomen:  BS normoactive. Soft, Nondistended, non-tender.  No masses or organomegaly.  Extremities: No pretibial edema.  Neurologic: A&O X3, CN II - XII are grossly intact. Motor strength is 5/5 in the all 4 extremities, Sensations intact to light touch, Cerebellar signs negative.  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    Lab results: Basic Metabolic Panel:  Basename  04/08/12 1235  NA 139  K 3.6  CL 103  CO2 22  GLUCOSE 108*  BUN 8  CREATININE 0.88  CALCIUM 9.6  MG --  PHOS --   Liver Function Tests:  Basename 04/08/12 1235  AST 64*  ALT 133*  ALKPHOS 101  BILITOT 0.2*  PROT 7.6  ALBUMIN 4.0   CBC:  Basename 04/08/12 1235  WBC 7.2  NEUTROABS 4.4  HGB 18.1*  HCT 49.4  MCV 88.7  PLT 187   Cardiac Enzymes:  Basename 04/08/12 1235  CKTOTAL --  CKMB --  CKMBINDEX --  TROPONINI <0.30   Urinalysis:  Basename 04/08/12 1257  COLORURINE YELLOW  LABSPEC  1.018  PHURINE 6.0  GLUCOSEU NEGATIVE  HGBUR NEGATIVE  BILIRUBINUR NEGATIVE  KETONESUR 15*  PROTEINUR NEGATIVE  UROBILINOGEN 0.2  NITRITE NEGATIVE  LEUKOCYTESUR NEGATIVE   Imaging results:  Dg Chest 2 View  04/08/2012  *RADIOLOGY REPORT*  Clinical Data: Chest pain and shortness of breath.  CHEST - 2 VIEW  Comparison: Chest x-ray 02/26/2012.  Findings: Lung volumes are normal.  No consolidative airspace disease. Mild diffuse bronchial wall thickening.  No pleural effusions.  No pneumothorax.  No pulmonary nodule or mass noted. Pulmonary vasculature and the cardiomediastinal silhouette are within normal limits.  IMPRESSION: 1.  There is very mild diffuse bronchial wall thickening, without other acute findings.  This is nonspecific, but could suggest reactive airway disease, or mild bronchitis.   Original Report Authenticated By: Florencia Reasons, M.D.      Other results:  EKG (04/08/2012) - Sinus Tachycardia, regular rate of approximately 130 bpm, right axis, ST segments: normal.     Assessment & Plan: Pt is a 36 y.o. yo male with a PMHx of bipolar disorder, chronic pain, who was admitted on 04/08/2012 with symptoms of shortness of breath with undetermined etiology.   Tachycardia, narrow & regular - sinus tachycardia with HR approaching 130bpm, consistent with patient's complaints of palpitations ("racing heart"). Etiology unclear. Patient has right-axis deviation on ECG,  but this is unchanged from previous ECG on 02/26/2012, at which time patient had also had hemoptysis but had CTA chest which was negative for PE. Geneva score indicates moderate risk for PE.Pt without fever, leukocytosis or signs of infection.  Does complain of left shoulder pain which is reproducible and tender to palpation on exam.  Does appear mildly anxious. -monitor on telemetry -serial EKGs -cycle cardiac enzymes -check urinalysis and urine drug screen -check TSH r/o hyperthyroidism -cont home regimen of anxiolytics valium and ativan   Chest pain - likely MSK as patient is TTP and identical pain is reproduced. EKG did not reveal any ST elevation, and troponin was negative in ED, making ACS unlikely thus far. TIMI Score indicates 5% risk fir mortality in next 14 days. Likelihood of PE as noted above. -cycle cardiac enzymes -serial EKGs -monitor on telemetry  Polycythemia- noted at least over past 2 months with Hgb 17.7-19.4.  DDx includes hemoconcentration secondary to hypovolemia, hypoxia secondary to cardiac or pulmonary disease, carboxyhemoglobin from smoking, or erythropoietin-secreting tumors which is rare ie renal/adrenal cell or hepatocellular carcinoma. 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 -check ANA -peripheral smear  Bipolar Disorder with Depression and Anxiety: stable with current therapy, will continue outpt regimen of diazepam, divalproex, lorazepam, mirtazapine and risperidone  Lung  nodules on CT chest: too small for biopsy or further characterization at this point, worrisome for bronchogenic carcinoma in setting of his smoking status and new polycythemia.  -f/u CT chest in 6 months  Chronic back pain: will manage with Tylenol  GERD: stable -cont PPI   DVT PPX - Lovenox    Signed: Elenor Legato, M.D. PGY-I, Internal Medicine Resident Pager: [phone] (7AM-5PM) 04/08/2012, 5:36 PM

## 2012-04-08 NOTE — ED Notes (Signed)
Pt placed on cardiac monitor, B/P and pulse ox. 

## 2012-04-08 NOTE — ED Provider Notes (Addendum)
History     CSN: [id number]  Arrival date & time 04/08/12  1209   First MD Initiated Contact with Patient 04/08/12 1248      Chief Complaint  Patient presents with  . Shortness of Breath    (Consider location/radiation/quality/duration/timing/severity/associated sxs/prior treatment) Patient is a 36 y.o. male presenting with chest pain. The history is provided by the patient.  Chest Pain Pertinent negatives for primary symptoms include no fever, no shortness of breath, no cough, no abdominal pain and no vomiting.   pt c/o pain to left chest for past 2 days. Constant. Dull. Worse w palpation and certain movements. Not related to activity or exertion. No better whether upright or supine. Non productive cough. No fever or chills. Denies sob. Not pleuritic. No leg pain or swelling. No dvt or pe hx. No fam hx premature cad. No immobility, trauma or recent surgery. Smoker. Denies assoc nv or diaphoresis. Denies chest wall injury, but has been doing some recent heavy lifting ?strain.     Past Medical History  Diagnosis Date  . Depression   . Anxiety   . Chronic back pain   . Reflux   . Bipolar 1 disorder     Past Surgical History  Procedure Date  . Appendectomy     Family History  Problem Relation Age of Onset  . Diabetes Mother   . Diabetes Father   . Asthma Sister     History  Substance Use Topics  . Smoking status: Current Every Day Smoker -- 0.5 packs/day    Types: Cigarettes  . Smokeless tobacco: Not on file  . Alcohol Use: No      Review of Systems  Constitutional: Negative for fever and chills.  HENT: Negative for neck pain.   Eyes: Negative for redness.  Respiratory: Negative for cough and shortness of breath.   Cardiovascular: Positive for chest pain. Negative for leg swelling.  Gastrointestinal: Negative for vomiting, abdominal pain and diarrhea.  Genitourinary: Negative for flank pain.  Musculoskeletal: Negative for back pain.  Skin: Negative for  rash.  Neurological: Negative for headaches.  Hematological: Does not bruise/bleed easily.  Psychiatric/Behavioral: Negative for confusion.    Allergies  Review of patient's allergies indicates no known allergies.  Home Medications   Current Outpatient Rx  Name Route Sig Dispense Refill  . DIAZEPAM 10 MG PO TABS Oral Take 10 mg by mouth every 6 (six) hours as needed. anxiety    . DIVALPROEX SODIUM 250 MG PO TBEC Oral Take 1,000 mg by mouth at bedtime.     Marland Kitchen LORAZEPAM 1 MG PO TABS Oral Take 1 mg by mouth 3 (three) times daily.    Marland Kitchen MIRTAZAPINE 15 MG PO TABS Oral Take 15 mg by mouth at bedtime.      Marland Kitchen RANITIDINE HCL 150 MG PO TABS Oral Take 450-600 mg by mouth 3 (three) times daily. Acid reflux    . RISPERIDONE 2 MG PO TABS Oral Take 2-4 mg by mouth 2 (two) times daily. Patient takes 1 tablet(2mg ) in the morning and 2 tablets(4mg ) at bedtime    . RANITIDINE HCL 150 MG PO CAPS Oral Take 450 mg by mouth 3 (three) times daily.      BP 150/88  Pulse 136  Temp 97.5 F (36.4 C)  Resp 22  SpO2 97%  Physical Exam  Nursing note and vitals reviewed. Constitutional: He is oriented to person, place, and time. He appears well-developed and well-nourished. No distress.  HENT:  Head: Atraumatic.  Eyes: Conjunctivae normal are normal. No scleral icterus.  Neck: Neck supple. No tracheal deviation present.  Cardiovascular: Normal rate, regular rhythm, normal heart sounds and intact distal pulses.   Pulmonary/Chest: Effort normal and breath sounds normal. No accessory muscle usage. No respiratory distress. He exhibits tenderness.  Abdominal: Soft. Bowel sounds are normal. He exhibits no distension. There is no tenderness.  Genitourinary:       No cva tenderness  Musculoskeletal: Normal range of motion. He exhibits no edema and no tenderness.       CTLS spine, non tender, aligned, no step off.   Neurological: He is alert and oriented to person, place, and time.  Skin: Skin is warm and dry.        Reticular rash, ?livido reticularis lower back.     Psychiatric:       anxious    ED Course  Procedures (including critical care time)  Labs Reviewed  CBC WITH DIFFERENTIAL - Abnormal; Notable for the following:    Hemoglobin 18.1 (*)     MCHC 36.6 (*)     All other components within normal limits  COMPREHENSIVE METABOLIC PANEL  TROPONIN I    Results for orders placed during the hospital encounter of 04/08/12  CBC WITH DIFFERENTIAL      Component Value Range   WBC 7.2  4.0 - 10.5 K/uL   RBC 5.57  4.22 - 5.81 MIL/uL   Hemoglobin 18.1 (*) 13.0 - 17.0 g/dL   HCT 04.5  40.9 - 81.1 %   MCV 88.7  78.0 - 100.0 fL   MCH 32.5  26.0 - 34.0 pg   MCHC 36.6 (*) 30.0 - 36.0 g/dL   RDW 91.4  78.2 - 95.6 %   Platelets 187  150 - 400 K/uL   Neutrophils Relative 61  43 - 77 %   Neutro Abs 4.4  1.7 - 7.7 K/uL   Lymphocytes Relative 29  12 - 46 %   Lymphs Abs 2.1  0.7 - 4.0 K/uL   Monocytes Relative 6  3 - 12 %   Monocytes Absolute 0.4  0.1 - 1.0 K/uL   Eosinophils Relative 4  0 - 5 %   Eosinophils Absolute 0.3  0.0 - 0.7 K/uL   Basophils Relative 1  0 - 1 %   Basophils Absolute 0.0  0.0 - 0.1 K/uL  COMPREHENSIVE METABOLIC PANEL      Component Value Range   Sodium 139  135 - 145 mEq/L   Potassium 3.6  3.5 - 5.1 mEq/L   Chloride 103  96 - 112 mEq/L   CO2 22  19 - 32 mEq/L   Glucose, Bld 108 (*) 70 - 99 mg/dL   BUN 8  6 - 23 mg/dL   Creatinine, Ser 2.13  0.50 - 1.35 mg/dL   Calcium 9.6  8.4 - 08.6 mg/dL   Total Protein 7.6  6.0 - 8.3 g/dL   Albumin 4.0  3.5 - 5.2 g/dL   AST 64 (*) 0 - 37 U/L   ALT 133 (*) 0 - 53 U/L   Alkaline Phosphatase 101  39 - 117 U/L   Total Bilirubin 0.2 (*) 0.3 - 1.2 mg/dL   GFR calc non Af Amer >90  >90 mL/min   GFR calc Af Amer >90  >90 mL/min  TROPONIN I      Component Value Range   Troponin I <0.30  <0.30 ng/mL  URINALYSIS, MICROSCOPIC ONLY      Component Value  Range   Color, Urine YELLOW  YELLOW   APPearance CLEAR  CLEAR   Specific Gravity,  Urine 1.018  1.005 - 1.030   pH 6.0  5.0 - 8.0   Glucose, UA NEGATIVE  NEGATIVE mg/dL   Hgb urine dipstick NEGATIVE  NEGATIVE   Bilirubin Urine NEGATIVE  NEGATIVE   Ketones, ur 15 (*) NEGATIVE mg/dL   Protein, ur NEGATIVE  NEGATIVE mg/dL   Urobilinogen, UA 0.2  0.0 - 1.0 mg/dL   Nitrite NEGATIVE  NEGATIVE   Leukocytes, UA NEGATIVE  NEGATIVE   WBC, UA 0-2  <3 WBC/hpf   RBC / HPF 0-2  <3 RBC/hpf   Bacteria, UA RARE  RARE   Urine-Other MUCOUS PRESENT     Dg Chest 2 View  04/08/2012  *RADIOLOGY REPORT*  Clinical Data: Chest pain and shortness of breath.  CHEST - 2 VIEW  Comparison: Chest x-ray 02/26/2012.  Findings: Lung volumes are normal.  No consolidative airspace disease. Mild diffuse bronchial wall thickening.  No pleural effusions.  No pneumothorax.  No pulmonary nodule or mass noted. Pulmonary vasculature and the cardiomediastinal silhouette are within normal limits.  IMPRESSION: 1.  There is very mild diffuse bronchial wall thickening, without other acute findings.  This is nonspecific, but could suggest reactive airway disease, or mild bronchitis.   Original Report Authenticated By: Florencia Reasons, M.D.    Dg Abd Acute W/chest  03/25/2012  *RADIOLOGY REPORT*  Clinical Data: Chest pain for 1 month, worse tonight.  Nausea and vomiting.  ACUTE ABDOMEN SERIES (ABDOMEN 2 VIEW & CHEST 1 VIEW)  Comparison: 04/07/2011 radiographs.  Findings: Mild atelectasis has developed at both lung bases.  There is no confluent airspace opacity, edema or pleural effusion.  Heart size and mediastinal contours are stable.  The bowel gas pattern is normal.  There is no free intraperitoneal air or suspicious calcification.  Surgical clips are present in the right lower quadrant of the abdomen.  The osseous structures appear normal.  IMPRESSION: No acute cardiopulmonary or abdominal process.  Minimal basilar atelectasis.   Original Report Authenticated By: Gerrianne Scale, M.D.       MDM  Iv ns. Labs.  Monitor. Ecg.   Reviewed nursing notes and prior charts for additional history.   Recent ct angio chest for same symptoms, negative for pe.   Pt appears anxious, reassured, ativan 1 mg iv.     Date: 04/08/2012  Rate: 135  Rhythm: sinus tachycardia  QRS Axis: right  Intervals: normal  ST/T Wave abnormalities: normal  Conduction Disutrbances:none  Narrative Interpretation:   Old EKG Reviewed: changes noted   pts hgb has been in 18-19 range, pt notes lower back pain intermittently in past on chronic/recurrent basis, reticular rash to lower back, mildly elevated ast/alt (no etoh use, no chol med use, etc) - ?polycythemia, pt denies workup in past.  tsb called to admit re cp rule out, further workup of polycythemia.       Suzi Roots, MD 04/08/12 1554  Suzi Roots, MD 04/08/12 [phone]

## 2012-04-09 ENCOUNTER — Encounter (HOSPITAL_COMMUNITY): Payer: Self-pay | Admitting: Cardiology

## 2012-04-09 DIAGNOSIS — F419 Anxiety disorder, unspecified: Secondary | ICD-10-CM | POA: Diagnosis present

## 2012-04-09 DIAGNOSIS — D751 Secondary polycythemia: Secondary | ICD-10-CM | POA: Diagnosis present

## 2012-04-09 DIAGNOSIS — R231 Pallor: Secondary | ICD-10-CM | POA: Diagnosis present

## 2012-04-09 DIAGNOSIS — R0602 Shortness of breath: Secondary | ICD-10-CM

## 2012-04-09 DIAGNOSIS — R000 Tachycardia, unspecified: Secondary | ICD-10-CM | POA: Diagnosis present

## 2012-04-09 DIAGNOSIS — M25512 Pain in left shoulder: Secondary | ICD-10-CM | POA: Diagnosis present

## 2012-04-09 DIAGNOSIS — R748 Abnormal levels of other serum enzymes: Secondary | ICD-10-CM | POA: Diagnosis present

## 2012-04-09 DIAGNOSIS — R918 Other nonspecific abnormal finding of lung field: Secondary | ICD-10-CM | POA: Diagnosis present

## 2012-04-09 HISTORY — PX: TRANSTHORACIC ECHOCARDIOGRAM: SHX275

## 2012-04-09 LAB — CK TOTAL AND CKMB (NOT AT ARMC)
CK, MB: 1.2 ng/mL (ref 0.3–4.0)
Relative Index: INVALID (ref 0.0–2.5)
Total CK: 51 U/L (ref 7–232)
Total CK: 55 U/L (ref 7–232)
Total CK: 54 U/L (ref 7–232)
Total CK: 57 U/L (ref 7–232)

## 2012-04-09 LAB — FOLATE: Folate: 5.5 ng/mL

## 2012-04-09 LAB — COMPREHENSIVE METABOLIC PANEL
ALT: 103 U/L — ABNORMAL HIGH (ref 0–53)
Alkaline Phosphatase: 75 U/L (ref 39–117)
CO2: 27 mEq/L (ref 19–32)
GFR calc Af Amer: >90 mL/min (ref >90)
GFR calc non Af Amer: 88 mL/min — ABNORMAL LOW (ref >90)
Glucose, Bld: 94 mg/dL (ref 70–99)
Potassium: 4.6 mEq/L (ref 3.5–5.1)
Sodium: 140 mEq/L (ref 135–145)
Total Bilirubin: 0.2 mg/dL — ABNORMAL LOW (ref 0.3–1.2)

## 2012-04-09 LAB — IRON AND TIBC
Iron: 52 ug/dL (ref 42–135)
UIBC: 286 ug/dL (ref 125–400)
UIBC: 228 ug/dL (ref 125–400)

## 2012-04-09 LAB — VITAMIN B12: Vitamin B-12: 450 pg/mL (ref 211–911)

## 2012-04-09 LAB — CBC
MCHC: 34.5 g/dL (ref 30.0–36.0)
RDW: 12.6 % (ref 11.5–15.5)

## 2012-04-09 LAB — ANA: Anti Nuclear Antibody(ANA): NEGATIVE

## 2012-04-09 LAB — TRANSFERRIN: Transferrin: 232 mg/dL (ref 200–360)

## 2012-04-09 MED ORDER — INFLUENZA VIRUS VACC SPLIT PF IM SUSP
0.2500 mL | INTRAMUSCULAR | Status: DC
  Filled 2012-04-09: qty 0.25

## 2012-04-09 MED ORDER — KETOROLAC TROMETHAMINE 30 MG/ML IJ SOLN
30.0000 mg | Freq: Once | INTRAMUSCULAR | Status: AC
  Administered 2012-04-09: 30 mg via INTRAVENOUS
  Filled 2012-04-09: qty 1

## 2012-04-09 MED ORDER — METOPROLOL TARTRATE 25 MG PO TABS
25.0000 mg | ORAL_TABLET | Freq: Two times a day (BID) | ORAL | Status: DC
  Administered 2012-04-09 – 2012-04-10 (×3): 25 mg via ORAL
  Filled 2012-04-09 (×4): qty 1

## 2012-04-09 NOTE — Care Management Note (Unsigned)
    Page 1 of 1   04/09/2012     2:04:13 PM   CARE MANAGEMENT NOTE 04/09/2012  Patient:  Alec Carter, Alec Carter   Account Number:  [id number]  Date Initiated:  04/09/2012  Documentation initiated by:  Amrie Gurganus  Subjective/Objective Assessment:   PT ADM 04/08/12 WITH CHEST PAIN, SOB.  PTA, PT INDEPENDENT, LIVES WITH SPOUSE.     Action/Plan:   WIFE TO PROVIDE CARE AT DISCHARGE.  WILL FOLLOW FOR HOME NEEDS AS PT PROGRESSES.   Anticipated DC Date:  04/10/2012   Anticipated DC Plan:  HOME/SELF CARE      DC Planning Services  CM consult      Choice offered to / List presented to:             Status of service:  In process, will continue to follow Medicare Important Message given?   (If response is "NO", the following Medicare IM given date fields will be blank) Date Medicare IM given:   Date Additional Medicare IM given:    Discharge Disposition:    Per UR Regulation:  Reviewed for med. necessity/level of care/duration of stay  If discussed at Long Length of Stay Meetings, dates discussed:    Comments:

## 2012-04-09 NOTE — Consult Note (Signed)
Reason for Consult: palpiations and weakness Referring Physician: Internal Medicine TS   Alec Carter is an 36 y.o. male.    Chief Complaint:  Palpitations and weakness     HPI: Patient is a 36 year old man with past medical history most significant for bipolar disorder, chronic left shoulder and back pain, gastric esophageal reflux disease and generalized anxiety was admitted 04/08/12 with chief complaints of palpitations and weakness. Patient said that he's been having palpitations for last one year. Palpitations are brought on by minimal exertion, stays for couple of hours before normalizing spontaneously. Patient is generally very anxious and has been positive review of system which includes chest pains for last 2 months, shortness of breath which has been progressively getting worse, shoulder pain, back pain, abdominal pain, cough in the setting of chronic tobacco abuse, nausea and vomiting.   Discussed his symptoms, increasing severity of episodes with chest pressure, like "an elephant on his chest" with radiation down Lt. Arm, "then a knife feeling like some one plunging knife all through his chest"  Associated with SOB and N&V.  This occurs 2-3 times a week and has been occuring for 2 months or so.  Each episode has become worse.  Tachycardia not only occurs with pain but at times while on sofa.    EKG with S.Tach HR 135 with rt ward axis, on admit. 03/25/12  EKG ST HR 120  And EKG 02/26/12 with ST HR 132, all similar.  Cardiac enzymes have been negative.   HGB 19.4, HCT 53.2  Drug screen pos for Benzodiazepines  Past Medical History  Diagnosis Date  . Depression   . Anxiety   . Chronic back pain   . Reflux   . Bipolar 1 disorder     Past Surgical History  Procedure Date  . Appendectomy     Family History  Problem Relation Age of Onset  . Diabetes Mother   . Diabetes Father   . Asthma Sister   . Cancer Maternal Aunt     lung    Social History:  reports that he has been  smoking Cigarettes.  He has been smoking about .5 packs per day. He does not have any smokeless tobacco history on file. He reports that he does not drink alcohol or use illicit drugs.  Allergies: No Known Allergies  Medications Prior to Admission  Medication Sig Dispense Refill  . diazepam (VALIUM) 10 MG tablet Take 10 mg by mouth every 6 (six) hours as needed. anxiety      . divalproex (DEPAKOTE) 250 MG EC tablet Take 1,000 mg by mouth at bedtime.       Marland Kitchen LORazepam (ATIVAN) 1 MG tablet Take 1 mg by mouth 3 (three) times daily.      . mirtazapine (REMERON) 15 MG tablet Take 15 mg by mouth at bedtime.        . ranitidine (ZANTAC) 150 MG tablet Take 450-600 mg by mouth 3 (three) times daily. Acid reflux      . risperiDONE (RISPERDAL) 2 MG tablet Take 2-4 mg by mouth 2 (two) times daily. Patient takes 1 tablet(2mg ) in the morning and 2 tablets(4mg ) at bedtime      . ranitidine (ZANTAC) 150 MG capsule Take 450 mg by mouth 3 (three) times daily.        Results for orders placed during the hospital encounter of 04/08/12 (from the past 48 hour(s))  CBC WITH DIFFERENTIAL     Status: Abnormal   Collection Time  04/08/12 12:35 PM      Component Value Range Comment   WBC 7.2  4.0 - 10.5 K/uL    RBC 5.57  4.22 - 5.81 MIL/uL    Hemoglobin 18.1 (*) 13.0 - 17.0 g/dL    HCT 16.1  09.6 - 04.5 %    MCV 88.7  78.0 - 100.0 fL    MCH 32.5  26.0 - 34.0 pg    MCHC 36.6 (*) 30.0 - 36.0 g/dL    RDW 40.9  81.1 - 91.4 %    Platelets 187  150 - 400 K/uL    Neutrophils Relative 61  43 - 77 %    Neutro Abs 4.4  1.7 - 7.7 K/uL    Lymphocytes Relative 29  12 - 46 %    Lymphs Abs 2.1  0.7 - 4.0 K/uL    Monocytes Relative 6  3 - 12 %    Monocytes Absolute 0.4  0.1 - 1.0 K/uL    Eosinophils Relative 4  0 - 5 %    Eosinophils Absolute 0.3  0.0 - 0.7 K/uL    Basophils Relative 1  0 - 1 %    Basophils Absolute 0.0  0.0 - 0.1 K/uL   COMPREHENSIVE METABOLIC PANEL     Status: Abnormal   Collection Time   04/08/12  12:35 PM      Component Value Range Comment   Sodium 139  135 - 145 mEq/L    Potassium 3.6  3.5 - 5.1 mEq/L    Chloride 103  96 - 112 mEq/L    CO2 22  19 - 32 mEq/L    Glucose, Bld 108 (*) 70 - 99 mg/dL    BUN 8  6 - 23 mg/dL    Creatinine, Ser 7.82  0.50 - 1.35 mg/dL    Calcium 9.6  8.4 - 95.6 mg/dL    Total Protein 7.6  6.0 - 8.3 g/dL    Albumin 4.0  3.5 - 5.2 g/dL    AST 64 (*) 0 - 37 U/L    ALT 133 (*) 0 - 53 U/L    Alkaline Phosphatase 101  39 - 117 U/L    Total Bilirubin 0.2 (*) 0.3 - 1.2 mg/dL    GFR calc non Af Amer >90  >90 mL/min    GFR calc Af Amer >90  >90 mL/min   TROPONIN I     Status: Normal   Collection Time   04/08/12 12:35 PM      Component Value Range Comment   Troponin I <0.30  <0.30 ng/mL   URINALYSIS, MICROSCOPIC ONLY     Status: Abnormal   Collection Time   04/08/12 12:57 PM      Component Value Range Comment   Color, Urine YELLOW  YELLOW    APPearance CLEAR  CLEAR    Specific Gravity, Urine 1.018  1.005 - 1.030    pH 6.0  5.0 - 8.0    Glucose, UA NEGATIVE  NEGATIVE mg/dL    Hgb urine dipstick NEGATIVE  NEGATIVE    Bilirubin Urine NEGATIVE  NEGATIVE    Ketones, ur 15 (*) NEGATIVE mg/dL    Protein, ur NEGATIVE  NEGATIVE mg/dL    Urobilinogen, UA 0.2  0.0 - 1.0 mg/dL    Nitrite NEGATIVE  NEGATIVE    Leukocytes, UA NEGATIVE  NEGATIVE    WBC, UA 0-2  <3 WBC/hpf    RBC / HPF 0-2  <3 RBC/hpf    Bacteria,  UA RARE  RARE    Urine-Other MUCOUS PRESENT     FERRITIN     Status: Normal   Collection Time   04/08/12  3:52 PM      Component Value Range Comment   Ferritin 158  22 - 322 ng/mL   IRON AND TIBC     Status: Abnormal   Collection Time   04/08/12  3:52 PM      Component Value Range Comment   Iron 52  42 - 135 ug/dL    TIBC 161  096 - 045 ug/dL    Saturation Ratios 15 (*) 20 - 55 %    UIBC 286  125 - 400 ug/dL   TRANSFERRIN     Status: Normal   Collection Time   04/08/12  3:52 PM      Component Value Range Comment   Transferrin 232  200 - 360  mg/dL   PROTIME-INR     Status: Normal   Collection Time   04/08/12  5:40 PM      Component Value Range Comment   Prothrombin Time 13.2  11.6 - 15.2 seconds    INR 1.01  0.00 - 1.49   APTT     Status: Normal   Collection Time   04/08/12  5:40 PM      Component Value Range Comment   aPTT 31  24 - 37 seconds   TSH     Status: Normal   Collection Time   04/08/12  5:40 PM      Component Value Range Comment   TSH 1.536  0.350 - 4.500 uIU/mL   HEMOGLOBIN A1C     Status: Normal   Collection Time   04/08/12  5:40 PM      Component Value Range Comment   Hemoglobin A1C 5.6  <5.7 %    Mean Plasma Glucose 114  <117 mg/dL   VITAMIN W09     Status: Normal   Collection Time   04/08/12  5:40 PM      Component Value Range Comment   Vitamin B-12 450  211 - 911 pg/mL   FOLATE     Status: Normal   Collection Time   04/08/12  5:40 PM      Component Value Range Comment   Folate 5.5     IRON AND TIBC     Status: Normal   Collection Time   04/08/12  5:40 PM      Component Value Range Comment   Iron 83  42 - 135 ug/dL    TIBC 811  914 - 782 ug/dL    Saturation Ratios 27  20 - 55 %    UIBC 228  125 - 400 ug/dL   FERRITIN     Status: Normal   Collection Time   04/08/12  5:40 PM      Component Value Range Comment   Ferritin 135  22 - 322 ng/mL   RETICULOCYTES     Status: Normal   Collection Time   04/08/12  5:40 PM      Component Value Range Comment   Retic Ct Pct 1.1  0.4 - 3.1 %    RBC. 5.12  4.22 - 5.81 MIL/uL    Retic Count, Manual 56.3  19.0 - 186.0 K/uL   TECHNOLOGIST SMEAR REVIEW     Status: Normal   Collection Time   04/08/12  5:40 PM      Component Value Range Comment   Tech Review  MORPHOLOGY UNREMARKABLE     ANA     Status: Normal   Collection Time   04/08/12  5:40 PM      Component Value Range Comment   ANA NEGATIVE  NEGATIVE   TROPONIN I     Status: Normal   Collection Time   04/08/12  5:41 PM      Component Value Range Comment   Troponin I <0.30  <0.30 ng/mL   CK TOTAL AND  CKMB     Status: Normal   Collection Time   04/08/12  5:41 PM      Component Value Range Comment   Total CK 54  7 - 232 U/L    CK, MB 1.2  0.3 - 4.0 ng/mL    Relative Index RELATIVE INDEX IS INVALID  0.0 - 2.5   URINE RAPID DRUG SCREEN (HOSP PERFORMED)     Status: Abnormal   Collection Time   04/08/12  6:45 PM      Component Value Range Comment   Opiates NONE DETECTED  NONE DETECTED    Cocaine NONE DETECTED  NONE DETECTED    Benzodiazepines POSITIVE (*) NONE DETECTED    Amphetamines NONE DETECTED  NONE DETECTED    Tetrahydrocannabinol NONE DETECTED  NONE DETECTED    Barbiturates NONE DETECTED  NONE DETECTED   URINALYSIS, ROUTINE W REFLEX MICROSCOPIC     Status: Normal   Collection Time   04/08/12  6:45 PM      Component Value Range Comment   Color, Urine YELLOW  YELLOW    APPearance CLEAR  CLEAR    Specific Gravity, Urine 1.013  1.005 - 1.030    pH 6.5  5.0 - 8.0    Glucose, UA NEGATIVE  NEGATIVE mg/dL    Hgb urine dipstick NEGATIVE  NEGATIVE    Bilirubin Urine NEGATIVE  NEGATIVE    Ketones, ur NEGATIVE  NEGATIVE mg/dL    Protein, ur NEGATIVE  NEGATIVE mg/dL    Urobilinogen, UA 0.2  0.0 - 1.0 mg/dL    Nitrite NEGATIVE  NEGATIVE    Leukocytes, UA NEGATIVE  NEGATIVE MICROSCOPIC NOT DONE ON URINES WITH NEGATIVE PROTEIN, BLOOD, LEUKOCYTES, NITRITE, OR GLUCOSE <1000 mg/dL.  BLOOD GAS, ARTERIAL     Status: Normal   Collection Time   04/08/12  7:14 PM      Component Value Range Comment   FIO2 0.21      pH, Arterial 7.422  7.350 - 7.450    pCO2 arterial 35.4  35.0 - 45.0 mmHg    pO2, Arterial 92.6  80.0 - 100.0 mmHg    Bicarbonate 22.7  20.0 - 24.0 mEq/L    TCO2 23.8  0 - 100 mmol/L    Acid-base deficit 1.1  0.0 - 2.0 mmol/L    O2 Saturation 97.4      Patient temperature 98.6      Collection site LEFT RADIAL      Drawn by [id number]      Sample type ARTERIAL DRAW      Allens test (pass/fail) PASS  PASS   TROPONIN I     Status: Normal   Collection Time   04/08/12 11:09 PM       Component Value Range Comment   Troponin I <0.30  <0.30 ng/mL   CK TOTAL AND CKMB     Status: Normal   Collection Time   04/08/12 11:09 PM      Component Value Range Comment   Total CK 55  7 -  232 U/L    CK, MB 1.0  0.3 - 4.0 ng/mL    Relative Index RELATIVE INDEX IS INVALID  0.0 - 2.5   CBC     Status: Normal   Collection Time   04/08/12 11:10 PM      Component Value Range Comment   WBC 9.0  4.0 - 10.5 K/uL    RBC 4.73  4.22 - 5.81 MIL/uL    Hemoglobin 15.0  13.0 - 17.0 g/dL DELTA CHECK NOTED   HCT 41.7  39.0 - 52.0 %    MCV 88.2  78.0 - 100.0 fL    MCH 31.7  26.0 - 34.0 pg    MCHC 36.0  30.0 - 36.0 g/dL    RDW 16.1  09.6 - 04.5 %    Platelets 168  150 - 400 K/uL   COMPREHENSIVE METABOLIC PANEL     Status: Abnormal   Collection Time   04/09/12  6:00 AM      Component Value Range Comment   Sodium 140  135 - 145 mEq/L    Potassium 4.6  3.5 - 5.1 mEq/L HEMOLYSIS AT THIS LEVEL MAY AFFECT RESULT   Chloride 104  96 - 112 mEq/L    CO2 27  19 - 32 mEq/L    Glucose, Bld 94  70 - 99 mg/dL    BUN 11  6 - 23 mg/dL    Creatinine, Ser 4.09  0.50 - 1.35 mg/dL    Calcium 9.1  8.4 - 81.1 mg/dL    Total Protein 6.2  6.0 - 8.3 g/dL    Albumin 3.1 (*) 3.5 - 5.2 g/dL    AST 60 (*) 0 - 37 U/L HEMOLYSIS AT THIS LEVEL MAY AFFECT RESULT   ALT 103 (*) 0 - 53 U/L HEMOLYSIS AT THIS LEVEL MAY AFFECT RESULT   Alkaline Phosphatase 75  39 - 117 U/L HEMOLYSIS AT THIS LEVEL MAY AFFECT RESULT   Total Bilirubin 0.2 (*) 0.3 - 1.2 mg/dL    GFR calc non Af Amer 88 (*) >90 mL/min    GFR calc Af Amer >90  >90 mL/min   CBC     Status: Normal   Collection Time   04/09/12  6:00 AM      Component Value Range Comment   WBC 7.5  4.0 - 10.5 K/uL    RBC 4.71  4.22 - 5.81 MIL/uL    Hemoglobin 14.8  13.0 - 17.0 g/dL    HCT 91.4  78.2 - 95.6 %    MCV 91.1  78.0 - 100.0 fL    MCH 31.4  26.0 - 34.0 pg    MCHC 34.5  30.0 - 36.0 g/dL    RDW 21.3  08.6 - 57.8 %    Platelets 165  150 - 400 K/uL   TROPONIN I     Status:  Normal   Collection Time   04/09/12  6:00 AM      Component Value Range Comment   Troponin I <0.30  <0.30 ng/mL   CK TOTAL AND CKMB     Status: Normal   Collection Time   04/09/12  6:00 AM      Component Value Range Comment   Total CK 51  7 - 232 U/L    CK, MB 1.0  0.3 - 4.0 ng/mL    Relative Index RELATIVE INDEX IS INVALID  0.0 - 2.5    Dg Chest 2 View  04/08/2012  *RADIOLOGY REPORT*  Clinical Data:  Chest pain and shortness of breath.  CHEST - 2 VIEW  Comparison: Chest x-ray 02/26/2012.  Findings: Lung volumes are normal.  No consolidative airspace disease. Mild diffuse bronchial wall thickening.  No pleural effusions.  No pneumothorax.  No pulmonary nodule or mass noted. Pulmonary vasculature and the cardiomediastinal silhouette are within normal limits.  IMPRESSION: 1.  There is very mild diffuse bronchial wall thickening, without other acute findings.  This is nonspecific, but could suggest reactive airway disease, or mild bronchitis.   Original Report Authenticated By: Florencia Reasons, M.D.     ROS: General:no colds or fevers Skin:no rashes or ulcers HEENT:no blurred vision but with the episode, more like tunnel vision CV:see HPI PUL:+ SOB with chest pain GI:no diarrhea constipation or melena GU:no hematuria ZO:XWRUEAV lt shoulder pain Neuro:no syncope Endo:no diabetes or thyroid disease   Blood pressure 129/75, pulse 81, temperature 97.3 F (36.3 C), temperature source Oral, resp. rate 18, height 6' (1.829 m), weight 109.1 kg (240 lb 8.4 oz), SpO2 97.00%. PE: General: NAD Skin: multiple tattoos HEENT: [place]/AT PERRLA, sclera anicteric Neck: no JVD Heart: RRR 1/6 sem Lungs: clear Abd:BS BS+ Ext:no edema Neuro: no focal findings    Assessment/Plan Principal Problem:  *Tachycardia Active Problems:  BIPOLAR AFFECTIVE DISORDER, DEPRESSED, MODERATE  BACK PAIN  Polycythemia  Shoulder pain, left  Multiple pulmonary nodules  Elevated liver enzymes, persistent  Anxiety  disorder  WPW (Wolff-Parkinson-White syndrome), question of  Livedo reticularis   PLAN: 2D Echo ordered, results pending. Negative MI.  See Dt. Gale Klar's note for further plans.  INGOLD,LAURA R 04/09/2012, 11:47 AM   Patient seen and examined. Agree with assessment and plan.36 yo WM with a 55yr history of tobacco abuse (started age 27). He has a h/o anxiety and bipolar disorder. He has noted recent exertional dyspnea and also episodes of chest pressure. Several ECG's have shown sinus tachycardia without acute STT changes. Cardiac enzymes are negative. Will add low dose beta-blockade; check echo. Will schedule for lexiscan myoview scan for ischemic assessment. Discussed smoking cessation.   Lennette Bihari, MD, [facility] 04/09/2012 12:31 PM

## 2012-04-09 NOTE — Progress Notes (Signed)
Subjective: Patient complains of heartburn this AM. Complains of L shoulder pain (unchanged in interval).   No interval events.   Objective: Vital signs in last 24 hours: Filed Vitals:   04/08/12 1645 04/08/12 1736 04/08/12 2115 04/09/12 0630  BP: 130/97 143/105 127/76 129/75  Pulse: 96 102 95 81  Temp:  97.4 F (36.3 C) 97.4 F (36.3 C) 97.3 F (36.3 C)  TempSrc:  Oral Oral Oral  Resp: 26 20 19 18   Height:  6' (1.829 m)    Weight:  240 lb 8.4 oz (109.1 kg)    SpO2: 96% 98% 98% 97%   Weight change:   Intake/Output Summary (Last 24 hours) at 04/09/12 1052 Last data filed at 04/09/12 0700  Gross per 24 hour  Intake    360 ml  Output      0 ml  Net    360 ml   Physical Exam: General: Awake and alert; NAD CV: Regular rate and rhythm Resp: Clear to auscultation bilaterally; no wheezes, rales, or rhonchi GI/Abd: Soft, non-tender, non-distended; normoactive bowel sounds Ext: no edema, clubbing, or cyanosis Skin: Warm, dry, intact   Lab Results: Basic Metabolic Panel:  Lab 04/09/12 1610 04/08/12 1235  NA 140 139  K 4.6 3.6  CL 104 103  CO2 27 22  GLUCOSE 94 108*  BUN 11 8  CREATININE 1.05 0.88  CALCIUM 9.1 9.6  MG -- --  PHOS -- --   Liver Function Tests:  Lab 04/09/12 0600 04/08/12 1235  AST 60* 64*  ALT 103* 133*  ALKPHOS 75 101  BILITOT 0.2* 0.2*  PROT 6.2 7.6  ALBUMIN 3.1* 4.0   CBC:  Lab 04/09/12 0600 04/08/12 2310 04/08/12 1235  WBC 7.5 9.0 --  NEUTROABS -- -- 4.4  HGB 14.8 15.0 --  HCT 42.9 41.7 --  MCV 91.1 88.2 --  PLT 165 168 --   Cardiac Enzymes:  Lab 04/09/12 0600 04/08/12 2309 04/08/12 1741  CKTOTAL 51 55 54  CKMB 1.0 1.0 1.2  CKMBINDEX -- -- --  TROPONINI <0.30 <0.30 <0.30   Hemoglobin A1C:  Lab 04/08/12 1740  HGBA1C 5.6   Thyroid Function Tests:  Lab 04/08/12 1740  TSH 1.536  T4TOTAL --  FREET4 --  T3FREE --  THYROIDAB --   Coagulation:  Lab 04/08/12 1740  LABPROT 13.2  INR 1.01   Anemia Panel:  Lab  04/08/12 1740  VITAMINB12 450  FOLATE 5.5  FERRITIN 135  TIBC 311  IRON 83  RETICCTPCT 1.1   Urine Drug Screen: Drugs of Abuse     Component Value Date/Time   LABOPIA NONE DETECTED 04/08/2012 1845   COCAINSCRNUR NONE DETECTED 04/08/2012 1845   LABBENZ POSITIVE* 04/08/2012 1845   AMPHETMU NONE DETECTED 04/08/2012 1845   THCU NONE DETECTED 04/08/2012 1845   LABBARB NONE DETECTED 04/08/2012 1845    Urinalysis:  Lab 04/08/12 1845 04/08/12 1257  COLORURINE YELLOW YELLOW  LABSPEC 1.013 1.018  PHURINE 6.5 6.0  GLUCOSEU NEGATIVE NEGATIVE  HGBUR NEGATIVE NEGATIVE  BILIRUBINUR NEGATIVE NEGATIVE  KETONESUR NEGATIVE 15*  PROTEINUR NEGATIVE NEGATIVE  UROBILINOGEN 0.2 0.2  NITRITE NEGATIVE NEGATIVE  LEUKOCYTESUR NEGATIVE NEGATIVE   Studies/Results: Dg Chest 2 View  04/08/2012  *RADIOLOGY REPORT*  Clinical Data: Chest pain and shortness of breath.  CHEST - 2 VIEW  Comparison: Chest x-ray 02/26/2012.  Findings: Lung volumes are normal.  No consolidative airspace disease. Mild diffuse bronchial wall thickening.  No pleural effusions.  No pneumothorax.  No pulmonary nodule or mass  noted. Pulmonary vasculature and the cardiomediastinal silhouette are within normal limits.  IMPRESSION: 1.  There is very mild diffuse bronchial wall thickening, without other acute findings.  This is nonspecific, but could suggest reactive airway disease, or mild bronchitis.   Original Report Authenticated By: Florencia Reasons, M.D.    Medications: I have reviewed the patient's current medications. Scheduled Meds:   . divalproex  1,000 mg Oral QHS  . enoxaparin (LOVENOX) injection  40 mg Subcutaneous Q24H  . LORazepam  1 mg Intravenous Once  . LORazepam  1 mg Oral TID  . mirtazapine  15 mg Oral QHS  . pantoprazole  40 mg Oral Q1200  . risperiDONE  2 mg Oral q morning - 10a  . risperiDONE  4 mg Oral QHS  . sodium chloride  1,000 mL Intravenous Once  . DISCONTD: risperiDONE  2-4 mg Oral BID   Continuous  Infusions:   . sodium chloride 150 mL/hr at 04/08/12 1839   PRN Meds:.acetaminophen, acetaminophen, diazepam, ondansetron (ZOFRAN) IV, ondansetron, oxyCODONE-acetaminophen Assessment/Plan: Pt is a 36 y.o. yo male with a PMHx of bipolar disorder, chronic pain, who was admitted on 04/08/2012 with symptoms of shortness of breath with undetermined etiology.   Tachycardia - resolved at this time. Patient has right-axis deviation on ECG, but this is unchanged from previous ECG on 02/26/2012, at which time patient had also had hemoptysis but had CTA chest which was negative for PE. Geneva score indicates moderate risk for PE.Pt without fever, leukocytosis or signs of infection. Does complain of left shoulder pain which is reproducible and tender to palpation on exam. CE neg x 3. Does appear mildly anxious. UA and UDS both negative. TSH wnl.  - continue to monitor on telemetry  - serial ECGs  - cont home regimen of anxiolytics valium and ativan  - check echo - consult cardiology  Chest pain - likely MSK as patient is TTP and identical pain is reproduced. EKG did not reveal any ST elevation, and troponin neg x 3. TIMI Score indicates 5% risk fir mortality in next 14 days. Likelihood of PE as noted above.  -serial EKGs  -monitor on telemetry   Polycythemia- resolved, likely 2/2 dehydration. Noted at least over past 2 months with Hgb 17.7-19.4. Normal ABG w/o hypoxia. Peripheral smear wnl. 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 Peripheral smear wnl.  -check ANA   Bipolar Disorder with Depression and Anxiety - stable with current therapy, will continue outpt regimen of diazepam, divalproex, lorazepam, mirtazapine and risperidone   Lung nodules on CT chest - too small for biopsy or further characterization at this point, worrisome for bronchogenic carcinoma in  setting of his smoking status and new polycythemia.  -f/u CT chest in 6 months   Chronic back pain -  will manage with Tylenol   GERD - stable  -cont PPI   DVT PPX - Lovenox    LOS: 1 day   Jermaine Tholl R 04/09/2012, 10:52 AM

## 2012-04-09 NOTE — Progress Notes (Signed)
Bilateral:  No evidence of DVT, superficial thrombosis, or Baker's Cyst.   

## 2012-04-09 NOTE — Progress Notes (Signed)
  Echocardiogram 2D Echocardiogram has been performed.  [place]ian Co 04/09/2012, 11:15 AM

## 2012-04-09 NOTE — H&P (Signed)
Internal Medicine Attending Admission Note Date: 04/09/2012  Patient name: Alec Carter Medical record number: [id number] Date of birth: Nov 25, 1975 Age: 36 y.o. Gender: male  I saw and evaluated the patient. I reviewed the resident's note and I agree with the resident's findings and plan as documented in the resident's note.  Chief Complaint(s): Palpitations and weakness  History - key components related to admission: Patient is a 36 year old man with past medical history most significant for bipolar disorder, chronic left shoulder and back pain, gastric esophageal reflux disease and generalized anxiety was admitted yesterday with chief complaints of palpitations and weakness. Patient said that he's been having palpitations for last one year. Palpitations are brought on by minimal exertion, stays for couple of hours before normalizing spontaneously. Patient is generally very anxious and has been positive review of system which includes chest pains for last 2 months, shortness of breath which has been progressively getting worse, shoulder pain, back pain, abdominal pain, cough in the setting of chronic tobacco abuse, nausea and vomiting.     Physical Exam - key components related to admission:  Filed Vitals:   04/08/12 1645 04/08/12 1736 04/08/12 2115 04/09/12 0630  BP: 130/97 143/105 127/76 129/75  Pulse: 96 102 95 81  Temp:  97.4 F (36.3 C) 97.4 F (36.3 C) 97.3 F (36.3 C)  TempSrc:  Oral Oral Oral  Resp: 26 20 19 18   Height:  6' (1.829 m)    Weight:  240 lb 8.4 oz (109.1 kg)    SpO2: 96% 98% 98% 97%   patient was lying in bed comfortably during the visit, he was alert and oriented x3 Respiratory: Clear to auscultation bilaterally without any rhonchi, rales, crackles Cardiovascular: Regular rate and rhythm, S1-S2 normal without murmurs rubs or gallops. Tender to palpation in the left upper chest.  Abdomen: Soft nontender nondistended Extremities: No edema noted Neuro exam:  Nonfocal     Lab results:   Basic Metabolic Panel:  Basename 04/09/12 0600 04/08/12 1235  NA 140 139  K 4.6 3.6  CL 104 103  CO2 27 22  GLUCOSE 94 108*  BUN 11 8  CREATININE 1.05 0.88  CALCIUM 9.1 9.6  MG -- --  PHOS -- --   Liver Function Tests:  Basename 04/09/12 0600 04/08/12 1235  AST 60* 64*  ALT 103* 133*  ALKPHOS 75 101  BILITOT 0.2* 0.2*  PROT 6.2 7.6  ALBUMIN 3.1* 4.0     CBC:  Basename 04/09/12 0600 04/08/12 2310 04/08/12 1235  WBC 7.5 9.0 --  NEUTROABS -- -- 4.4  HGB 14.8 15.0 --  HCT 42.9 41.7 --  MCV 91.1 88.2 --  PLT 165 168 --   Cardiac Enzymes:  Basename 04/09/12 0600 04/08/12 2309 04/08/12 1741  CKTOTAL 51 55 54  CKMB 1.0 1.0 1.2  CKMBINDEX -- -- --  TROPONINI <0.30 <0.30 <0.30    Hemoglobin A1C:  Basename 04/08/12 1740  HGBA1C 5.6   Thyroid Function Tests:  Basename 04/08/12 1740  TSH 1.536  T4TOTAL --  FREET4 --  T3FREE --  THYROIDAB --   Anemia Panel:  Basename 04/08/12 1740  VITAMINB12 450  FOLATE 5.5  FERRITIN 135  TIBC 311  IRON 83  RETICCTPCT 1.1   Coagulation:  Basename 04/08/12 1740  INR 1.01    Misc. Labs: Troponins have been negative x3  Imaging results:  Dg Chest 2 View  04/08/2012   IMPRESSION: 1.  There is very mild diffuse bronchial wall thickening, without other acute  findings.  This is nonspecific, but could suggest reactive airway disease, or mild bronchitis.   Original Report Authenticated By: Florencia Reasons, M.D.     Other results: EKG: Tachycardic, right axis deviation, no ST or T waves changes consistent with ischemia, normal intervals, Patient does have delta waves in lead 1 which were present in previous EKGs as well.    Assessment & Plan by Problem:  Principal Problem:  *Tachycardia Active Problems:  BIPOLAR AFFECTIVE DISORDER, DEPRESSED, MODERATE  BACK PAIN  Polycythemia  Shoulder pain, left  Multiple pulmonary nodules  Elevated liver enzymes, persistent  Anxiety  disorder  WPW (Wolff-Parkinson-White syndrome), question of  Livedo reticularis  Patient is a 36 year old man with past medical history most significant for bipolar disorder, chronic tobacco abuse, pulmonary nodules, history of elevated liver enzymes, anxiety disorder who comes in with palpitations and generalized weakness along with multiple other complaints. Patient's chest pain is reproducible on exam and also it has been present for past 2 months intermittently. Given the atypical nature of chest pain and low TIMI score makes coronary artery disease highly unlikely at this time. We did cycle cardiac enzymes which are negative so far.  Patient does have palpitations documented on EKG with a questionable did have a consistent with Wolff-Parkinson-White syndrome. Given that patient is symptomatic from WPW I would like to get cardiology input for a possible EP study versus managing this medically with beta blockers or calcium channel blockers at this time. Patient's pulmonary nodules needs to be followed up in 6 months. Patient did have hemoglobin as high as 19.4 which makes polycythemia vera as one of the differentials. Other more likely causes of her chronic hypoxemia from pulmonary disease secondary to smoking. We are checking related labs look into correctable causes.  Lars Mage MD  Pager [phone]

## 2012-04-10 ENCOUNTER — Inpatient Hospital Stay (HOSPITAL_COMMUNITY): Payer: MEDICAID

## 2012-04-10 ENCOUNTER — Encounter (HOSPITAL_COMMUNITY): Payer: Self-pay | Admitting: Cardiology

## 2012-04-10 DIAGNOSIS — Z87898 Personal history of other specified conditions: Secondary | ICD-10-CM

## 2012-04-10 DIAGNOSIS — R079 Chest pain, unspecified: Secondary | ICD-10-CM | POA: Diagnosis present

## 2012-04-10 HISTORY — PX: CARDIOVASCULAR STRESS TEST: SHX262

## 2012-04-10 HISTORY — DX: Personal history of other specified conditions: Z87.898

## 2012-04-10 LAB — CK TOTAL AND CKMB (NOT AT ARMC)
CK, MB: 1.3 ng/mL (ref 0.3–4.0)
CK, MB: 1.4 ng/mL (ref 0.3–4.0)
Relative Index: INVALID (ref 0.0–2.5)
Total CK: 53 U/L (ref 7–232)
Total CK: 62 U/L (ref 7–232)

## 2012-04-10 MED ORDER — TECHNETIUM TC 99M SESTAMIBI GENERIC - CARDIOLITE
30.0000 | Freq: Once | INTRAVENOUS | Status: AC | PRN
  Administered 2012-04-10: 30 via INTRAVENOUS

## 2012-04-10 MED ORDER — METOPROLOL TARTRATE 25 MG PO TABS: 25.0000 mg | ORAL_TABLET | Freq: Two times a day (BID) | ORAL | Status: DC

## 2012-04-10 MED ORDER — REGADENOSON 0.4 MG/5ML IV SOLN
0.4000 mg | Freq: Once | INTRAVENOUS | Status: AC
  Administered 2012-04-10: 0.4 mg via INTRAVENOUS

## 2012-04-10 MED ORDER — IBUPROFEN 800 MG PO TABS: 800.0000 mg | ORAL_TABLET | Freq: Four times a day (QID) | ORAL | Status: DC | PRN

## 2012-04-10 MED ORDER — INFLUENZA VIRUS VACC SPLIT PF IM SUSP
0.5000 mL | INTRAMUSCULAR | Status: DC
  Filled 2012-04-10: qty 0.5

## 2012-04-10 MED ORDER — TECHNETIUM TC 99M SESTAMIBI GENERIC - CARDIOLITE
10.0000 | Freq: Once | INTRAVENOUS | Status: AC | PRN
  Administered 2012-04-10: 10 via INTRAVENOUS

## 2012-04-10 MED ORDER — IBUPROFEN 800 MG PO TABS
800.0000 mg | ORAL_TABLET | Freq: Four times a day (QID) | ORAL | Status: DC | PRN
  Administered 2012-04-10: 800 mg via ORAL
  Filled 2012-04-10: qty 1

## 2012-04-10 NOTE — Progress Notes (Signed)
Subjective: No further pain  HR in the 70's  Objective: Vital signs in last 24 hours: Temp:  [97.4 F (36.3 C)-98.2 F (36.8 C)] 97.4 F (36.3 C) (09/21 0452) Pulse Rate:  [74-104] 74  (09/21 0452) Resp:  [18-19] 19  (09/20 2006) BP: (101-162)/(58-89) 115/69 mmHg (09/21 0452) SpO2:  [94 %-97 %] 97 % (09/21 0452) Weight change:  Last BM Date: 04/09/12 Intake/Output from previous day:+1080 09/20 0701 - 09/21 0700 In: 720 [P.O.:720] Out: -  Intake/Output this shift:    PE: General:alert and oriented, NAD Heart:S1S2 RRR soft systolic murmur Lungs:clear without rales rhonchi or wheezes Abd:+ BS, soft non tender Ext:no edema    Lab Results:  Basename 04/09/12 0600 04/08/12 2310  WBC 7.5 9.0  HGB 14.8 15.0  HCT 42.9 41.7  PLT 165 168   BMET  Basename 04/09/12 0600 04/08/12 1235  NA 140 139  K 4.6 3.6  CL 104 103  CO2 27 22  GLUCOSE 94 108*  BUN 11 8  CREATININE 1.05 0.88  CALCIUM 9.1 9.6    Basename 04/09/12 0600 04/08/12 2309  TROPONINI <0.30 <0.30    Lab Results  Component Value Date   CHOL  Value: 203        ATP III CLASSIFICATION:  <200     mg/dL   Desirable  161-096  mg/dL   Borderline High  >=045    mg/dL   High       * 40/98/1191   HDL 43 05/02/2010   LDLCALC  Value: 139        Total Cholesterol/HDL:CHD Risk Coronary Heart Disease Risk Table                     Men   Women  1/2 Average Risk   3.4   3.3  Average Risk       5.0   4.4  2 X Average Risk   9.6   7.1  3 X Average Risk  23.4   11.0        Use the calculated Patient Ratio above and the CHD Risk Table to determine the patient's CHD Risk.        ATP III CLASSIFICATION (LDL):  <100     mg/dL   Optimal  478-295  mg/dL   Near or Above                    Optimal  130-159  mg/dL   Borderline  621-308  mg/dL   High  >657     mg/dL   Very High* 84/69/6295   TRIG 107 05/02/2010   CHOLHDL 4.7 05/02/2010   Lab Results  Component Value Date   HGBA1C 5.6 04/08/2012     Lab Results  Component Value Date   TSH 1.536 04/08/2012    Hepatic Function Panel  Basename 04/09/12 0600  PROT 6.2  ALBUMIN 3.1*  AST 60*  ALT 103*  ALKPHOS 75  BILITOT 0.2*  BILIDIR --  IBILI --     EKG: Orders placed during the hospital encounter of 04/08/12  . EKG 12-LEAD  . EKG 12-LEAD  . EKG 12-LEAD    Studies/Results: Dg Chest 2 View  04/08/2012  *RADIOLOGY REPORT*  Clinical Data: Chest pain and shortness of breath.  CHEST - 2 VIEW  Comparison: Chest x-ray 02/26/2012.  Findings: Lung volumes are normal.  No consolidative airspace disease. Mild diffuse bronchial wall thickening.  No pleural effusions.  No pneumothorax.  No pulmonary nodule or mass noted. Pulmonary vasculature and the cardiomediastinal silhouette are within normal limits.  IMPRESSION: 1.  There is very mild diffuse bronchial wall thickening, without other acute findings.  This is nonspecific, but could suggest reactive airway disease, or mild bronchitis.   Original Report Authenticated By: Florencia Reasons, M.D.    2D Echo Left ventricle: The cavity size was normal. Systolic function was normal. The estimated ejection fraction was in the range of 60% to 65%. Wall motion was normal; there were no regional wall motion abnormalities. Left ventricular diastolic function parameters were normal. - Mitral valve: Mildly calcified annulus. - Atrial septum: No defect or patent foramen ovale was identified  Medications: I have reviewed the patient's current medications. Scheduled Meds:    . divalproex  1,000 mg Oral QHS  . enoxaparin (LOVENOX) injection  40 mg Subcutaneous Q24H  . influenza  inactive virus vaccine  0.5 mL Intramuscular Tomorrow-1000  . ketorolac  30 mg Intravenous Once  . LORazepam  1 mg Oral TID  . metoprolol tartrate  25 mg Oral BID  . mirtazapine  15 mg Oral QHS  . pantoprazole  40 mg Oral Q1200  . risperiDONE  2 mg Oral q morning - 10a  . risperiDONE  4 mg Oral QHS  . DISCONTD: influenza  inactive virus vaccine  0.25  mL Intramuscular Tomorrow-1000   Continuous Infusions:    . sodium chloride 150 mL/hr at 04/08/12 1839   PRN Meds:.acetaminophen, acetaminophen, diazepam, ibuprofen, ondansetron (ZOFRAN) IV, ondansetron  Assessment/Plan: Principal Problem:  *Tachycardia Active Problems:  BIPOLAR AFFECTIVE DISORDER, DEPRESSED, MODERATE  BACK PAIN  Polycythemia  Shoulder pain, left  Multiple pulmonary nodules  Elevated liver enzymes, persistent  Anxiety disorder  Livedo reticularis  Chest pain at rest, negative MI, r/o cardiac though anxiety may be component  PLAN: for nuc study today, if negative may discharge.  No lipid therapy due to elevated LFTs. ? Gallbladder disease.   AddendumSteffanie Dunn completed without complications.  Nuc. Results to follow.  LOS: 2 days   INGOLD,LAURA R 04/10/2012, 9:08 AM   Patient seen and examined. Agree with assessment and plan. No further chest pain. Await myoview images. Increased LDL. Increased Liver transaminases. Consider [place] for further evaluation.   Lennette Bihari, MD, [facility] 04/10/2012 9:58 AM

## 2012-04-10 NOTE — Progress Notes (Addendum)
Subjective: No acute events overnight  Objective: Vital signs in last 24 hours: Filed Vitals:   04/09/12 1353 04/09/12 2006 04/09/12 2142 04/10/12 0452  BP: 162/89 101/58 108/62 115/69  Pulse: 104 77  74  Temp: 98.2 F (36.8 C) 97.7 F (36.5 C)  97.4 F (36.3 C)  TempSrc: Oral Oral  Oral  Resp: 18 19    Height:      Weight:      SpO2: 94% 95%  97%   Weight change:   Intake/Output Summary (Last 24 hours) at 04/10/12 0723 Last data filed at 04/09/12 1700  Gross per 24 hour  Intake    720 ml  Output      0 ml  Net    720 ml   Physical Exam: General: Awake and alert; NAD CV: Regular rate and rhythm Resp: Clear to auscultation bilaterally; no wheezes, rales, or rhonchi GI/Abd: Soft, non-tender, non-distended; normoactive bowel sounds Ext: no edema, clubbing, or cyanosis Skin: Warm, dry, intact, +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, multiple tattoos   Lab Results: Basic Metabolic Panel:  Lab 04/09/12 8413 04/08/12 1235  NA 140 139  K 4.6 3.6  CL 104 103  CO2 27 22  GLUCOSE 94 108*  BUN 11 8  CREATININE 1.05 0.88  CALCIUM 9.1 9.6  MG -- --  PHOS -- --   Liver Function Tests:  Lab 04/09/12 0600 04/08/12 1235  AST 60* 64*  ALT 103* 133*  ALKPHOS 75 101  BILITOT 0.2* 0.2*  PROT 6.2 7.6  ALBUMIN 3.1* 4.0   CBC:  Lab 04/09/12 0600 04/08/12 2310 04/08/12 1235  WBC 7.5 9.0 --  NEUTROABS -- -- 4.4  HGB 14.8 15.0 --  HCT 42.9 41.7 --  MCV 91.1 88.2 --  PLT 165 168 --   Cardiac Enzymes:  Lab 04/10/12 0540 04/09/12 2309 04/09/12 1810 04/09/12 0600 04/08/12 2309 04/08/12 1741  CKTOTAL 53 57 54 -- -- --  CKMB 1.3 1.3 1.2 -- -- --  CKMBINDEX -- -- -- -- -- --  TROPONINI -- -- -- <0.30 <0.30 <0.30   Hemoglobin A1C:  Lab 04/08/12 1740  HGBA1C 5.6   Thyroid Function Tests:  Lab 04/08/12 1740  TSH 1.536  T4TOTAL --  FREET4 --  T3FREE --  THYROIDAB --   Coagulation:  Lab 04/08/12 1740  LABPROT 13.2  INR 1.01   Anemia Panel:  Lab 04/08/12 1740    VITAMINB12 450  FOLATE 5.5  FERRITIN 135  TIBC 311  IRON 83  RETICCTPCT 1.1   Urine Drug Screen: Drugs of Abuse     Component Value Date/Time   LABOPIA NONE DETECTED 04/08/2012 1845   COCAINSCRNUR NONE DETECTED 04/08/2012 1845   LABBENZ POSITIVE* 04/08/2012 1845   AMPHETMU NONE DETECTED 04/08/2012 1845   THCU NONE DETECTED 04/08/2012 1845   LABBARB NONE DETECTED 04/08/2012 1845    Urinalysis:  Lab 04/08/12 1845 04/08/12 1257  COLORURINE YELLOW YELLOW  LABSPEC 1.013 1.018  PHURINE 6.5 6.0  GLUCOSEU NEGATIVE NEGATIVE  HGBUR NEGATIVE NEGATIVE  BILIRUBINUR NEGATIVE NEGATIVE  KETONESUR NEGATIVE 15*  PROTEINUR NEGATIVE NEGATIVE  UROBILINOGEN 0.2 0.2  NITRITE NEGATIVE NEGATIVE  LEUKOCYTESUR NEGATIVE NEGATIVE   Studies/Results: Dg Chest 2 View  04/08/2012  *RADIOLOGY REPORT*  Clinical Data: Chest pain and shortness of breath.  CHEST - 2 VIEW  Comparison: Chest x-ray 02/26/2012.  Findings: Lung volumes are normal.  No consolidative airspace disease. Mild diffuse bronchial wall thickening.  No pleural effusions.  No pneumothorax.  No pulmonary  nodule or mass noted. Pulmonary vasculature and the cardiomediastinal silhouette are within normal limits.  IMPRESSION: 1.  There is very mild diffuse bronchial wall thickening, without other acute findings.  This is nonspecific, but could suggest reactive airway disease, or mild bronchitis.   Original Report Authenticated By: Florencia Reasons, M.D.    Medications: I have reviewed the patient's current medications. Scheduled Meds:    . divalproex  1,000 mg Oral QHS  . enoxaparin (LOVENOX) injection  40 mg Subcutaneous Q24H  . influenza  inactive virus vaccine  0.5 mL Intramuscular Tomorrow-1000  . ketorolac  30 mg Intravenous Once  . LORazepam  1 mg Oral TID  . metoprolol tartrate  25 mg Oral BID  . mirtazapine  15 mg Oral QHS  . pantoprazole  40 mg Oral Q1200  . risperiDONE  2 mg Oral q morning - 10a  . risperiDONE  4 mg Oral QHS  .  DISCONTD: influenza  inactive virus vaccine  0.25 mL Intramuscular Tomorrow-1000   Continuous Infusions:    . sodium chloride 150 mL/hr at 04/08/12 1839   PRN Meds:.acetaminophen, acetaminophen, diazepam, ondansetron (ZOFRAN) IV, ondansetron, oxyCODONE-acetaminophen  Assessment/Plan: Pt is a 36 y.o. yo male with a PMHx of bipolar disorder, chronic pain, who was admitted on 04/08/2012 with symptoms of shortness of breath with undetermined etiology.   Tachycardia - resolved at this time. Intermittent overnight, mildly anxious. UA and UDS both negative. TSH wnl. ECHO unrevealing for etiology, Scheduled for Lexiscan today per Cardiology - continue to monitor on telemetry  - cont home regimen of anxiolytics valium and ativan   Chest pain - likely MSK as patient is TTP   -monitor on telemetry  -Acetaminophen or ibuprofen for pain  Polycythemia- resolved, likely 2/2 dehydration. Noted at least over past 2 months with Hgb 17.7-19.4. Normal ABG w/o hypoxia. Peripheral smear wnl. 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 Peripheral smear wnl. ANA negative.    Bipolar Disorder with Depression and Anxiety - stable with current therapy, will continue outpt regimen of diazepam, divalproex, lorazepam, mirtazapine and risperidone   Lung nodules on CT chest - too small for biopsy or further characterization at this point, worrisome for bronchogenic carcinoma in setting of his smoking status and new polycythemia.  -f/u CT chest in 6 months   Chronic back pain -  will manage with Tylenol   GERD - stable  -cont PPI   DVT PPX - Lovenox  Disposition: likely d/c today after lexiscan if no further Cardiology intervention -f/u with his PCP Lorayne Marek [place], [place]) at [facility] 04/13/2012 at 10am   LOS: 2 days   Camarie Mctigue 04/10/2012, 7:23 AM

## 2012-04-14 NOTE — Discharge Summary (Signed)
Patient Name:  Alec Carter MRN: [id number]  PCP: Pcp Not In System DOB:  10/19/1975       Date of Admission:  04/08/2012  Date of Discharge:  04/10/2012     Attending Physician: Dr. Lars Mage         DISCHARGE DIAGNOSES: Tachycardia Chest pain Polycythemia Livedo Reticularis Bipolar Disorder Anxiety Lung nodules on CT chest Chronic back pain GERD    DISPOSITION AND FOLLOW-UP: Alec Carter is to follow-up with the listed providers as detailed below, at which time, the following should be addressed:   1. F/u on control of patient's anxiety, and consider escalating therapy as needed given what appears to be worsening of this issue.  2. F/u on polycythemia to ensure that this process has not recurred 3. Labs / imaging needed: CBC 4. Pending labs/ test needing follow-up: N/A  Follow-up Information    Follow up with MUSE,ROCHELLE D., PA. On 04/13/2012. (at 10am, bring proof of income and discharge papers from hospitalization)    Contact information:   [facility] Department [address] [place] [address] [phone]         Discharge Orders    Future Orders Please Complete By Expires   Discharge instructions      Comments:   Follow-up with [facility] as scheduled and follow-up with your psychiatrist.  Your heart evaluation is normal (ECHO, Nuclear Scan).  Your racing heart may be related to anxiety.  You may take ibuprofen for you back and shoulder pain.  Address this with your Health Department Provider.       DISCHARGE MEDICATIONS:   Medication List     As of 04/14/2012  2:04 PM    TAKE these medications         diazepam 10 MG tablet   Commonly known as: VALIUM   Take 10 mg by mouth every 6 (six) hours as needed. anxiety      divalproex 250 MG DR tablet   Commonly known as: DEPAKOTE   Take 1,000 mg by mouth at bedtime.      ibuprofen 800 MG tablet   Commonly known as: ADVIL,MOTRIN   Take 1 tablet (800 mg total) by  mouth every 6 (six) hours as needed.      LORazepam 1 MG tablet   Commonly known as: ATIVAN   Take 1 mg by mouth 3 (three) times daily.      metoprolol tartrate 25 MG tablet   Commonly known as: LOPRESSOR   Take 1 tablet (25 mg total) by mouth 2 (two) times daily.      mirtazapine 15 MG tablet   Commonly known as: REMERON   Take 15 mg by mouth at bedtime.      ranitidine 150 MG capsule   Commonly known as: ZANTAC   Take 450 mg by mouth 3 (three) times daily.      ranitidine 150 MG tablet   Commonly known as: ZANTAC   Take 450-600 mg by mouth 3 (three) times daily. Acid reflux      risperiDONE 2 MG tablet   Commonly known as: RISPERDAL   Take 2-4 mg by mouth 2 (two) times daily. Patient takes 1 tablet(2mg ) in the morning and 2 tablets(4mg ) at bedtime         CONSULTS:  Treatment Team:  Lennette Bihari, MD    PROCEDURES PERFORMED:  Dg Chest 2 View  04/08/2012  *RADIOLOGY REPORT*  Clinical Data: Chest pain and  shortness of breath.  CHEST - 2 VIEW  Comparison: Chest x-ray 02/26/2012.  Findings: Lung volumes are normal.  No consolidative airspace disease. Mild diffuse bronchial wall thickening.  No pleural effusions.  No pneumothorax.  No pulmonary nodule or mass noted. Pulmonary vasculature and the cardiomediastinal silhouette are within normal limits.  IMPRESSION: 1.  There is very mild diffuse bronchial wall thickening, without other acute findings.  This is nonspecific, but could suggest reactive airway disease, or mild bronchitis.   Original Report Authenticated By: Florencia Reasons, M.D.    Nm Myocar Multi W/spect W/wall Motion / Ef  04/10/2012  *RADIOLOGY REPORT*  Clinical Data:  Chest pain  MYOCARDIAL IMAGING WITH SPECT (REST AND PHARMACOLOGIC-STRESS) GATED LEFT VENTRICULAR WALL MOTION STUDY LEFT VENTRICULAR EJECTION FRACTION  Technique:  Standard myocardial SPECT imaging was performed after resting intravenous injection of 10.3 mCi Tc-6m tetrofosmin. Subsequently,  intravenous infusion of lexiscan was performed under the supervision of the Cardiology staff.  At peak effect of the drug, 33 mCi Tc-65m tetrofosmin was injected intravenously and standard myocardial SPECT  imaging was performed.  Quantitative gated imaging was also performed to evaluate left ventricular wall motion, and estimate left ventricular ejection fraction.  Comparison:  Chest radiograph - 04/08/2012  Findings:  Review of the rotational raw images demonstrates minimal amount of GI activity on both the rest and stress images.  There is mild patient motion artifact, worse on the stress images.  SPECT imaging demonstrates homogeneous distribution of injector radiotracer.  No geographic areas of matched or mismatched perfusion.  Quantitative gated analysis shows normal wall motion.  The resting left ventricular ejection fraction is 65% with end- diastolic volume of 84 ml and end-systolic volume of 29 ml.  IMPRESSION: 1.  No scintigraphic evidence of prior infarction or pharmacologically induced ischemia. 2.  Normal wall motion.  Ejection fraction - 65%.   Original Report Authenticated By: Waynard Reeds, M.D.    Dg Abd Acute W/chest  03/25/2012  *RADIOLOGY REPORT*  Clinical Data: Chest pain for 1 month, worse tonight.  Nausea and vomiting.  ACUTE ABDOMEN SERIES (ABDOMEN 2 VIEW & CHEST 1 VIEW)  Comparison: 04/07/2011 radiographs.  Findings: Mild atelectasis has developed at both lung bases.  There is no confluent airspace opacity, edema or pleural effusion.  Heart size and mediastinal contours are stable.  The bowel gas pattern is normal.  There is no free intraperitoneal air or suspicious calcification.  Surgical clips are present in the right lower quadrant of the abdomen.  The osseous structures appear normal.  IMPRESSION: No acute cardiopulmonary or abdominal process.  Minimal basilar atelectasis.   Original Report Authenticated By: Gerrianne Scale, M.D.        ADMISSION DATA: H&P: Patient is a 36  y.o. male with a PMHx of bipolar disorder and chronic pain who presents to [facility] for evaluation of palpitations. Patient states that he has been feeling as if his heart is beating very fast and that this has been occurring constantly for approximately one month. He also states that he was working on his wife's car approximately one week prior to presentation and felt very easily tired at that time, which is unusual for him. He states that both of these issues continued to persist which led to him to seek care at the [facility] ED.  The patient also complains of occasional chest pain and shortness of breath for approximately 1-2 months. The patient describes the chest pain as left-sided, unrelated to activity, and unchanged with  position. The shortness of breath occurs throughout the day, and has not changed over the last month. He also complains of cough productive of green sputum as well as scant hemoptysis, which have both been present for approximately 2 months. The patient has chronic nausea and vomiting, and states that for the past two weeks he has vomiting twice per day on average. He believes that the chest pain, as well as the nausea and vomiting, are likely secondary to anxiety.  Mr. Binstock visited the ED on 03/25/2012 for similar complaints, but workup did not reveal any identifiable cause of his discomfort. Prior to that presentation, patient was seen in the ED on 02/26/2012 for back pain, but concern was present for pulmonary embolism which resulted in the patient receiving CTA chest. No PE was identified, but patient was found to have multiple small R lung nodules (~93mm) and periaortic lymphadenopathy. No further plans for follow-up were made with the patient at that time.  Physical Exam: Blood pressure 130/97, pulse 96, temperature 97.5 F (36.4 C), resp. rate 26, SpO2 96.00%.  Physical Exam:  General:  Vital signs reviewed and noted. Well-developed, well-nourished, in no acute distress; alert, appropriate  and cooperative throughout examination.   Head:  Normocephalic, atraumatic.   Eyes:  Mild anisocoria; pupils reactive. Mild strabismus. No signs of anemia or jaundice.   Nose:  Mucous membranes moist, not inflammed, nonerythematous.   Throat:  Oropharynx nonerythematous, no exudate appreciated.   Neck:  No deformities, masses, or tenderness noted.   Lungs:  Normal respiratory effort. Clear to auscultation BL without crackles or wheezes.   Heart/chest:  RRR. S1 and S2 normal without gallop, murmur, or rubs. TTP of left-chest, with pain reproduced in quality and quantity.   Abdomen:  BS normoactive. Soft, Nondistended, non-tender. No masses or organomegaly.   Extremities:  No pretibial edema.   Neurologic:  A&O X3, CN II - XII are grossly intact. Motor strength is 5/5 in the all 4 extremities, Sensations intact to light touch, Cerebellar signs negative.   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     Labs: Basic Metabolic Panel:   Basename  04/08/12 1235   NA  139   K  3.6   CL  103   CO2  22   GLUCOSE  108*   BUN  8   CREATININE  0.88   CALCIUM  9.6   MG  --   PHOS  --    Liver Function Tests:   Basename  04/08/12 1235   AST  64*   ALT  133*   ALKPHOS  101   BILITOT  0.2*   PROT  7.6   ALBUMIN  4.0    CBC:   Basename  04/08/12 1235   WBC  7.2   NEUTROABS  4.4   HGB  18.1*   HCT  49.4   MCV  88.7   PLT  187    Cardiac Enzymes:   Basename  04/08/12 1235   CKTOTAL  --   CKMB  --   CKMBINDEX  --   TROPONINI  <0.30    Urinalysis:   Basename  04/08/12 1257   COLORURINE  YELLOW   LABSPEC  1.018   PHURINE  6.0   GLUCOSEU  NEGATIVE   HGBUR  NEGATIVE   BILIRUBINUR  NEGATIVE   KETONESUR  15*   PROTEINUR  NEGATIVE   UROBILINOGEN  0.2   NITRITE  NEGATIVE   LEUKOCYTESUR  NEGATIVE  HOSPITAL COURSE: Tachycardia/chest pain - likely secondary to anxiety, as patient has significant history of anxiety and psychiatric illness, and also had some  insight that these issues might be causing his symptoms (tachycardia, chest pain, palpitations).  UA and UDS both negative. TSH wnl. ECHO unrevealing. ACS was an initial concern, but CE were negative x 3. Patient had EKG with findings concerning for acute right heart strain, but when taken in context of EKG in 02/2012 which revealed similar findings, suspicion for acute pulmonary pathology was lowered substantially. Also in 02/2012 the patient had a CTA chest for similar complaints, which was negative.   Cardiology was consulted for the patient's tachycardia/chest pain, and a lexiscan was performed which was non-diagnostic as only 57% of patient's max HR was able to be observed (2/2 to medication). He then had a myoview performed, which was negative for any ischemia, and revealed a normal EF (65%).  At time of discharge the patient was having no subsequent chest pain. The chest pain again was likely related to anxiety and possibly MSK strain as he had TTP with reproducibility and a recent history of physical exertion (changing brakes on car).   Palpitations - given patient's palpitations (perceived increased HR) despite his home psychiatric regimen including high-dose benzodiazepines, medical management of his tachycardia was deemed appropriate. He was started on metoprolol, which brought his HR into normal range and resolved his palpitations. This approach was effective in controlling his palpitations, and simultaneously improved his anxiety.   Polycythemia- elevated Hb on admission (~18), which resolved to baseline after rehydration. Likely 2/2 to hemoconcentration. Anemia panel within normal limits. JAK2 testing performed, which was negative.   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 Unclear etiology. Concern was present for possible autoimmune etiology or possibly secondary to hematologic abnormality, but peripheral smear and ANA negative.   Bipolar Disorder - stable  on current therapy, although patient was complaining of marked anxiety during course, and his symptoms of tachycardia improved dramatically after instituting his home lorazepam and diazepam (and also metoprolol). Otherwise stable on home meds.   Anxiety - Worsened during course, and likely the etiology of the patient's tachycardia, chest pain, and palpitations. Patient is on both ativan and valium, and would benefit from having his regimen simplified as an outpatient (should likely have one of these medications discontinued).   Lung nodules on CT chest - too small for biopsy or further characterization at this point. Patient was instructed he will need a follow-up CT chest in 6-12 months for this issue.    Chronic back pain - stable, managed with tylenol PRN.  GERD - stable. Received PPI during course, was discharged on home ranitidine.   Dispo - patient's symptoms of tachycardia and chest pain were likely secondary to anxiety, and also possible non-compliance with psychiatric medications (as he improved dramatically after reintroduction of his home regimen).   DISCHARGE DATA: Vital Signs: BP 120/67  Pulse 77  Temp 98.3 F (36.8 C) (Oral)  Resp 18  Ht 6' (1.829 m)  Wt 240 lb 8.4 oz (109.1 kg)  BMI 32.62 kg/m2  SpO2 97%   Time spent on discharge: >30 minutes  Signed: Elfredia Nevins, MD   PGY I, Internal Medicine Resident 04/14/2012, 2:05 PM

## 2012-05-20 ENCOUNTER — Emergency Department (HOSPITAL_COMMUNITY)
Admission: EM | Admit: 2012-05-20 | Discharge: 2012-05-20 | Disposition: A | Discharge status: Home / Self Care | Payer: Self-pay | Attending: Emergency Medicine | Admitting: Emergency Medicine

## 2012-05-20 ENCOUNTER — Encounter (HOSPITAL_COMMUNITY): Payer: Self-pay | Admitting: Adult Health

## 2012-05-20 DIAGNOSIS — F329 Major depressive disorder, single episode, unspecified: Secondary | ICD-10-CM | POA: Insufficient documentation

## 2012-05-20 DIAGNOSIS — M549 Dorsalgia, unspecified: Secondary | ICD-10-CM | POA: Insufficient documentation

## 2012-05-20 DIAGNOSIS — Z79899 Other long term (current) drug therapy: Secondary | ICD-10-CM | POA: Insufficient documentation

## 2012-05-20 DIAGNOSIS — F17200 Nicotine dependence, unspecified, uncomplicated: Secondary | ICD-10-CM | POA: Insufficient documentation

## 2012-05-20 DIAGNOSIS — F309 Manic episode, unspecified: Secondary | ICD-10-CM | POA: Insufficient documentation

## 2012-05-20 DIAGNOSIS — K219 Gastro-esophageal reflux disease without esophagitis: Secondary | ICD-10-CM | POA: Insufficient documentation

## 2012-05-20 DIAGNOSIS — F3289 Other specified depressive episodes: Secondary | ICD-10-CM | POA: Insufficient documentation

## 2012-05-20 DIAGNOSIS — F411 Generalized anxiety disorder: Secondary | ICD-10-CM | POA: Insufficient documentation

## 2012-05-20 MED ORDER — OXYCODONE-ACETAMINOPHEN 5-325 MG PO TABS
1.0000 | ORAL_TABLET | Freq: Once | ORAL | Status: AC
  Administered 2012-05-20: 1 via ORAL
  Filled 2012-05-20: qty 1

## 2012-05-20 MED ORDER — OXYCODONE-ACETAMINOPHEN 5-325 MG PO TABS: 1.0000 | ORAL_TABLET | Freq: Four times a day (QID) | ORAL | Status: DC | PRN

## 2012-05-20 NOTE — ED Notes (Signed)
Presents with lower back pain that radiates down right leg. Pain is chronic but has worsened over the course of the day. Denies injury. Pain described as achy. Nothing makes pain better, movement and twisting makes pain worse.

## 2012-05-20 NOTE — ED Provider Notes (Signed)
History     CSN: [id number]  Arrival date & time 05/20/12  1940   First MD Initiated Contact with Patient 05/20/12 1959      Chief Complaint  Patient presents with  . Back Pain    (Consider location/radiation/quality/duration/timing/severity/associated sxs/prior treatment) HPI Comments: Is a 36 year old gentleman, with a history of chronic back pain.  States he was in his normal state of health until this morning, when he got out of bed and twisted, funny.  He's had pain.  That radiates down his right leg since noon.  He is currently taking Valium, and ibuprofen for his discomfort, without any BP he does not report any incontinence of bowel or bladder  The history is provided by the patient.    Past Medical History  Diagnosis Date  . Depression   . Anxiety   . Chronic back pain   . Reflux   . Bipolar 1 disorder   . Chest pain at rest, negative MI, r/o cardiac though anxiety may be component 04/10/2012    Past Surgical History  Procedure Date  . Appendectomy     Family History  Problem Relation Age of Onset  . Diabetes Mother   . Diabetes Father   . Asthma Sister   . Cancer Maternal Aunt     lung     History  Substance Use Topics  . Smoking status: Current Every Day Smoker -- 0.5 packs/day    Types: Cigarettes  . Smokeless tobacco: Not on file  . Alcohol Use: No      Review of Systems  Allergies  Review of patient's allergies indicates no known allergies.  Home Medications   Current Outpatient Rx  Name Route Sig Dispense Refill  . DIAZEPAM 10 MG PO TABS Oral Take 10 mg by mouth every 6 (six) hours as needed. For anxiety    . IBUPROFEN 800 MG PO TABS Oral Take 800 mg by mouth every 6 (six) hours as needed. For pain    . LORAZEPAM 1 MG PO TABS Oral Take 1 mg by mouth every 8 (eight) hours as needed. For anxiety    . METOPROLOL TARTRATE 25 MG PO TABS Oral Take 1 tablet (25 mg total) by mouth 2 (two) times daily. 60 tablet 0  . MIRTAZAPINE 15 MG PO TABS  Oral Take 15 mg by mouth at bedtime.      Marland Kitchen RANITIDINE HCL 150 MG PO TABS Oral Take 450-600 mg by mouth 3 (three) times daily. Acid reflux    . RISPERIDONE 2 MG PO TABS Oral Take 2 mg by mouth 2 (two) times daily. Patient takes 1 tablet(2mg ) in the morning and 2 tablets(4mg ) at bedtime     . SERTRALINE HCL 50 MG PO TABS Oral Take 50 mg by mouth daily.    . OXYCODONE-ACETAMINOPHEN 5-325 MG PO TABS Oral Take 1-2 tablets by mouth every 6 (six) hours as needed for pain. 20 tablet 0  . RANITIDINE HCL 150 MG PO CAPS Oral Take 450 mg by mouth 3 (three) times daily.      BP 111/75  Pulse 103  Temp 97.7 F (36.5 C) (Oral)  Resp 16  SpO2 96%  Physical Exam  Constitutional: He is oriented to person, place, and time. He appears well-developed and well-nourished.  HENT:  Head: Normocephalic.  Eyes: Pupils are equal, round, and reactive to light.  Neck: Normal range of motion.  Cardiovascular: Normal rate.   Pulmonary/Chest: Effort normal.  Abdominal: Soft.  Musculoskeletal: Normal range of  motion. He exhibits tenderness.       Back:  Neurological: He is alert and oriented to person, place, and time.  Skin: Skin is warm.    ED Course  Procedures (including critical care time)  Labs Reviewed - No data to display No results found.   1. Back pain       MDM   Issue of his chronic pain.  He, states he can't bite [facility] health Department.  He called today and they couldn't work him in until next week       Arman Filter, NP 05/20/12 2021

## 2012-05-20 NOTE — ED Notes (Signed)
Pt discharged.Vital signs stable and GCS 15 

## 2012-05-21 NOTE — ED Provider Notes (Signed)
Medical screening examination/treatment/procedure(s) were performed by non-physician practitioner and as supervising physician I was immediately available for consultation/collaboration.   Joya Gaskins, MD 05/21/12 [phone]

## 2012-05-29 ENCOUNTER — Encounter (HOSPITAL_COMMUNITY): Payer: Self-pay | Admitting: *Deleted

## 2012-05-29 ENCOUNTER — Emergency Department (HOSPITAL_COMMUNITY): Payer: Self-pay

## 2012-05-29 ENCOUNTER — Emergency Department (HOSPITAL_COMMUNITY)
Admission: EM | Admit: 2012-05-29 | Discharge: 2012-05-29 | Disposition: A | Discharge status: Home / Self Care | Payer: Self-pay | Attending: Emergency Medicine | Admitting: Emergency Medicine

## 2012-05-29 DIAGNOSIS — S92919A Unspecified fracture of unspecified toe(s), initial encounter for closed fracture: Secondary | ICD-10-CM | POA: Insufficient documentation

## 2012-05-29 DIAGNOSIS — S92911A Unspecified fracture of right toe(s), initial encounter for closed fracture: Secondary | ICD-10-CM

## 2012-05-29 DIAGNOSIS — G8929 Other chronic pain: Secondary | ICD-10-CM | POA: Insufficient documentation

## 2012-05-29 DIAGNOSIS — Z79899 Other long term (current) drug therapy: Secondary | ICD-10-CM | POA: Insufficient documentation

## 2012-05-29 DIAGNOSIS — K219 Gastro-esophageal reflux disease without esophagitis: Secondary | ICD-10-CM | POA: Insufficient documentation

## 2012-05-29 DIAGNOSIS — F17200 Nicotine dependence, unspecified, uncomplicated: Secondary | ICD-10-CM | POA: Insufficient documentation

## 2012-05-29 DIAGNOSIS — F329 Major depressive disorder, single episode, unspecified: Secondary | ICD-10-CM | POA: Insufficient documentation

## 2012-05-29 DIAGNOSIS — F411 Generalized anxiety disorder: Secondary | ICD-10-CM | POA: Insufficient documentation

## 2012-05-29 DIAGNOSIS — Y9389 Activity, other specified: Secondary | ICD-10-CM | POA: Insufficient documentation

## 2012-05-29 DIAGNOSIS — F3289 Other specified depressive episodes: Secondary | ICD-10-CM | POA: Insufficient documentation

## 2012-05-29 DIAGNOSIS — IMO0002 Reserved for concepts with insufficient information to code with codable children: Secondary | ICD-10-CM | POA: Insufficient documentation

## 2012-05-29 DIAGNOSIS — F319 Bipolar disorder, unspecified: Secondary | ICD-10-CM | POA: Insufficient documentation

## 2012-05-29 DIAGNOSIS — Y929 Unspecified place or not applicable: Secondary | ICD-10-CM | POA: Insufficient documentation

## 2012-05-29 MED ORDER — HYDROCODONE-ACETAMINOPHEN 5-325 MG PO TABS: ORAL_TABLET | ORAL | Status: DC

## 2012-05-29 MED ORDER — HYDROCODONE-ACETAMINOPHEN 5-325 MG PO TABS
1.0000 | ORAL_TABLET | Freq: Once | ORAL | Status: AC
  Administered 2012-05-29: 1 via ORAL
  Filled 2012-05-29: qty 1

## 2012-05-29 NOTE — ED Provider Notes (Signed)
History     CSN: [id number]  Arrival date & time 05/29/12  Alec Carter   First MD Initiated Contact with Patient 05/29/12 1944      Chief Complaint  Patient presents with  . Foot Pain    (Consider location/radiation/quality/duration/timing/severity/associated sxs/prior treatment) HPI Comments: Patient c/o pain, swelling and bruising to the right fourth toe that began on the evening PTA after he kicked a kerosene heater.  He states the pain is worse with weight bearing and palpation.  Improves with rest.  He denies radiation of pain.    Patient is a 36 y.o. male presenting with toe pain. The history is provided by the patient.  Toe Pain This is a new problem. The current episode started in the past 7 days. The problem occurs constantly. The problem has been unchanged. Associated symptoms include arthralgias and joint swelling. Pertinent negatives include no chills, fever, neck pain, numbness, rash, sore throat, swollen glands, vomiting or weakness. The symptoms are aggravated by bending, walking and standing. He has tried ice for the symptoms. The treatment provided no relief.    Past Medical History  Diagnosis Date  . Depression   . Anxiety   . Chronic back pain   . Reflux   . Bipolar 1 disorder   . Chest pain at rest, negative MI, r/o cardiac though anxiety may be component 04/10/2012    Past Surgical History  Procedure Date  . Appendectomy     Family History  Problem Relation Age of Onset  . Diabetes Mother   . Diabetes Father   . Asthma Sister   . Cancer Maternal Aunt     lung     History  Substance Use Topics  . Smoking status: Current Every Day Smoker -- 0.5 packs/day    Types: Cigarettes  . Smokeless tobacco: Not on file  . Alcohol Use: No      Review of Systems  Constitutional: Negative for fever and chills.  HENT: Negative for sore throat and neck pain.   Gastrointestinal: Negative for vomiting.  Genitourinary: Negative for dysuria and difficulty urinating.   Musculoskeletal: Positive for joint swelling and arthralgias. Negative for back pain.  Skin: Negative for color change, rash and wound.  Neurological: Negative for weakness and numbness.  All other systems reviewed and are negative.    Allergies  Review of patient's allergies indicates no known allergies.  Home Medications   Current Outpatient Rx  Name  Route  Sig  Dispense  Refill  . DIAZEPAM 10 MG PO TABS   Oral   Take 10 mg by mouth every 6 (six) hours as needed. For anxiety         . IBUPROFEN 800 MG PO TABS   Oral   Take 800 mg by mouth every 6 (six) hours as needed. For pain         . LORAZEPAM 1 MG PO TABS   Oral   Take 1 mg by mouth every 8 (eight) hours as needed. For anxiety         . METOPROLOL TARTRATE 25 MG PO TABS   Oral   Take 1 tablet (25 mg total) by mouth 2 (two) times daily.   60 tablet   0   . MIRTAZAPINE 15 MG PO TABS   Oral   Take 15 mg by mouth at bedtime.           . OXYCODONE-ACETAMINOPHEN 5-325 MG PO TABS   Oral   Take 1-2 tablets by mouth  every 6 (six) hours as needed for pain.   20 tablet   0   . RANITIDINE HCL 150 MG PO TABS   Oral   Take 450-600 mg by mouth 3 (three) times daily. Acid reflux         . RISPERIDONE 2 MG PO TABS   Oral   Take 2-4 mg by mouth 2 (two) times daily. Patient takes 1 tablet(2mg ) in the morning and 2 tablets(4mg ) at bedtime          . SERTRALINE HCL 50 MG PO TABS   Oral   Take 50 mg by mouth daily.         Marland Kitchen RANITIDINE HCL 150 MG PO CAPS   Oral   Take 450 mg by mouth 3 (three) times daily.           BP 153/91  Pulse 101  Temp 98 F (36.7 C) (Oral)  Resp 24  Ht 6' (1.829 m)  Wt 240 lb (108.863 kg)  BMI 32.55 kg/m2  SpO2 97%  Physical Exam  Nursing note and vitals reviewed. Constitutional: He is oriented to person, place, and time. He appears well-developed and well-nourished. No distress.  HENT:  Head: Normocephalic and atraumatic.  Cardiovascular: Normal rate, regular  rhythm, normal heart sounds and intact distal pulses.   Pulmonary/Chest: Effort normal and breath sounds normal.  Musculoskeletal: He exhibits edema and tenderness.       ttp of the right fourth toe, ROM of the toe is limited due to pain.  Moderate bruising.  DP pulse is brisk, distal sensation intact.  No erythema, abrasion,  or deformity.    Neurological: He is alert and oriented to person, place, and time. He exhibits normal muscle tone. Coordination normal.  Skin: Skin is warm and dry.    ED Course  Procedures (including critical care time)  Labs Reviewed - No data to display Dg Foot Complete Right  05/29/2012  *RADIOLOGY REPORT*  Clinical Data: Trauma with pain across the metatarsals and in the fourth toe.  RIGHT FOOT COMPLETE - 3+ VIEW  Comparison: None.  Findings: No evidence of fracture, no evidence of metatarsal injury.  I think there is probably a fracture of the proximal dorsal corner of the distal phalanx of the fourth toe.  IMPRESSION: Nondisplaced fracture of the proximal dorsal corner of the distal phalanx of the fourth toe.   Original Report Authenticated By: Paulina Fusi, M.D.         MDM    Toe was buddy taped and post op shoe applied.  Pain improved, remains NV intact.  Pt advised of XR findings and agrees to f/u with Dr. Romeo Apple if needed.   Prescribed: norco #[id number]   Alec Carter, [place] 05/31/12 0124

## 2012-05-29 NOTE — ED Notes (Signed)
Pt c/o pain to second to last toe on his right foot after kicking a kerosene heater.

## 2012-05-31 NOTE — ED Provider Notes (Signed)
Medical screening examination/treatment/procedure(s) were performed by non-physician practitioner and as supervising physician I was immediately available for consultation/collaboration.  Jandel Patriarca, MD 05/31/12 1026 

## 2012-09-04 ENCOUNTER — Other Ambulatory Visit: Payer: Self-pay

## 2012-09-14 ENCOUNTER — Emergency Department (HOSPITAL_COMMUNITY)
Admission: EM | Admit: 2012-09-14 | Discharge: 2012-09-14 | Disposition: A | Discharge status: Home / Self Care | Payer: Self-pay | Attending: Emergency Medicine | Admitting: Emergency Medicine

## 2012-09-14 ENCOUNTER — Emergency Department (HOSPITAL_COMMUNITY): Payer: Self-pay

## 2012-09-14 ENCOUNTER — Encounter (HOSPITAL_COMMUNITY): Payer: Self-pay | Admitting: *Deleted

## 2012-09-14 DIAGNOSIS — R079 Chest pain, unspecified: Secondary | ICD-10-CM | POA: Insufficient documentation

## 2012-09-14 DIAGNOSIS — K219 Gastro-esophageal reflux disease without esophagitis: Secondary | ICD-10-CM | POA: Insufficient documentation

## 2012-09-14 DIAGNOSIS — R51 Headache: Secondary | ICD-10-CM | POA: Insufficient documentation

## 2012-09-14 DIAGNOSIS — M5430 Sciatica, unspecified side: Secondary | ICD-10-CM | POA: Insufficient documentation

## 2012-09-14 DIAGNOSIS — M5432 Sciatica, left side: Secondary | ICD-10-CM

## 2012-09-14 DIAGNOSIS — F319 Bipolar disorder, unspecified: Secondary | ICD-10-CM | POA: Insufficient documentation

## 2012-09-14 DIAGNOSIS — Z79899 Other long term (current) drug therapy: Secondary | ICD-10-CM | POA: Insufficient documentation

## 2012-09-14 DIAGNOSIS — R61 Generalized hyperhidrosis: Secondary | ICD-10-CM | POA: Insufficient documentation

## 2012-09-14 DIAGNOSIS — F17200 Nicotine dependence, unspecified, uncomplicated: Secondary | ICD-10-CM | POA: Insufficient documentation

## 2012-09-14 DIAGNOSIS — G8929 Other chronic pain: Secondary | ICD-10-CM | POA: Insufficient documentation

## 2012-09-14 DIAGNOSIS — F411 Generalized anxiety disorder: Secondary | ICD-10-CM | POA: Insufficient documentation

## 2012-09-14 MED ORDER — CYCLOBENZAPRINE HCL 10 MG PO TABS
5.0000 mg | ORAL_TABLET | Freq: Once | ORAL | Status: AC
  Administered 2012-09-14: 5 mg via ORAL
  Filled 2012-09-14: qty 1

## 2012-09-14 MED ORDER — PREDNISONE 10 MG PO TABS: 20.0000 mg | ORAL_TABLET | Freq: Every day | ORAL | Status: DC

## 2012-09-14 MED ORDER — PREDNISONE 20 MG PO TABS
60.0000 mg | ORAL_TABLET | Freq: Once | ORAL | Status: AC
  Administered 2012-09-14: 60 mg via ORAL
  Filled 2012-09-14: qty 3

## 2012-09-14 MED ORDER — OXYCODONE-ACETAMINOPHEN 5-325 MG PO TABS
1.0000 | ORAL_TABLET | Freq: Once | ORAL | Status: AC
  Administered 2012-09-14: 1 via ORAL
  Filled 2012-09-14: qty 1

## 2012-09-14 MED ORDER — CYCLOBENZAPRINE HCL 10 MG PO TABS: 10.0000 mg | ORAL_TABLET | Freq: Two times a day (BID) | ORAL | Status: DC | PRN

## 2012-09-14 MED ORDER — OXYCODONE-ACETAMINOPHEN 5-325 MG PO TABS: 1.0000 | ORAL_TABLET | Freq: Four times a day (QID) | ORAL | Status: DC | PRN

## 2012-09-14 NOTE — ED Notes (Signed)
The pt is /o lower back pain since he lifted his child Sunday.  Pain since then.  He has chronic back pain

## 2012-09-14 NOTE — ED Provider Notes (Signed)
History    This chart was scribed for non-physician practitioner working with Alec Chick, MD by Alec Carter, ED Scribe. This patient was seen in room TR06C/TR06C and the patient's care was started at 10:25 PM.    CSN: [id number]  Arrival date & time 09/14/12  2144   First MD Initiated Contact with Patient 09/14/12 2208      Chief Complaint  Patient presents with  . Back Pain     The history is provided by the patient. No language interpreter was used.  Alec Carter is a 37 y.o. male who presents to the Emergency Department complaining of intermittent stabbing lower back pain that radiates down posterior left lower extremity to foot that suddenly began 2 days ago after bending forwards.  Pain worsened by movements.  No heavy lifting, unusual activities, falls, or traumas.  Pt has h/o sciatica since 2008 and states that current pain is worse and that chronic pain normally radiates down posterior right lower extremity.  Pt denies incontinence of bowel or bladder, numbness, weakness, dysuria.  No h/o IV drug use.  Pt has used 800mg  ibuprofen with no relief.   Pt also complains of 2 months of intermittent, dull, throbbing HA with associated one month of intermittent night sweats and one month of intermittent chest pain with deep breathing.   Pt is a current everyday smoker.  No prior hx of cancer  Past Medical History  Diagnosis Date  . Depression   . Anxiety   . Chronic back pain   . Reflux   . Bipolar 1 disorder   . Chest pain at rest, negative MI, r/o cardiac though anxiety may be component 04/10/2012    Past Surgical History  Procedure Laterality Date  . Appendectomy      Family History  Problem Relation Age of Onset  . Diabetes Mother   . Diabetes Father   . Asthma Sister   . Cancer Maternal Aunt     lung     History  Substance Use Topics  . Smoking status: Current Every Day Smoker -- 0.50 packs/day    Types: Cigarettes  . Smokeless tobacco: Not on file  .  Alcohol Use: No      Review of Systems  Constitutional: Negative for fever and chills.  Cardiovascular: Positive for chest pain.  Genitourinary: Negative for dysuria.  Musculoskeletal: Positive for back pain.  Neurological: Positive for headaches. Negative for weakness and numbness.  All other systems reviewed and are negative.    Allergies  Review of patient's allergies indicates no known allergies.  Home Medications   Current Outpatient Rx  Name  Route  Sig  Dispense  Refill  . diazepam (VALIUM) 10 MG tablet   Oral   Take 10 mg by mouth every 6 (six) hours as needed. For anxiety         . ibuprofen (ADVIL,MOTRIN) 800 MG tablet   Oral   Take 800 mg by mouth every 6 (six) hours as needed. For pain         . LORazepam (ATIVAN) 1 MG tablet   Oral   Take 1 mg by mouth every 8 (eight) hours as needed. For anxiety         . metoprolol tartrate (LOPRESSOR) 25 MG tablet   Oral   Take 1 tablet (25 mg total) by mouth 2 (two) times daily.   60 tablet   0   . mirtazapine (REMERON) 15 MG tablet   Oral  Take 15 mg by mouth at bedtime.           . ranitidine (ZANTAC) 150 MG tablet   Oral   Take 450-600 mg by mouth 3 (three) times daily. Acid reflux         . risperiDONE (RISPERDAL) 2 MG tablet   Oral   Take 2-4 mg by mouth 2 (two) times daily. Patient takes 1 tablet(2mg ) in the morning and 2 tablets(4mg ) at bedtime          . sertraline (ZOLOFT) 50 MG tablet   Oral   Take 50 mg by mouth daily.         Marland Kitchen EXPIRED: ranitidine (ZANTAC) 150 MG capsule   Oral   Take 450 mg by mouth 3 (three) times daily.           BP 135/99  Pulse 128  Temp(Src) 97.4 F (36.3 C) (Oral)  Resp 18  SpO2 96%  Physical Exam  Nursing note and vitals reviewed. Constitutional: He is oriented to person, place, and time. He appears well-developed and well-nourished. No distress.  HENT:  Head: Normocephalic and atraumatic.  Eyes: EOM are normal.  Neck: Neck supple. No  tracheal deviation present.  Cardiovascular: Normal rate.   Pulmonary/Chest: Effort normal. No respiratory distress.  Genitourinary:  No CVA tenderness  Musculoskeletal: Normal range of motion.  Mild tenderness to para lumbar region without any significant midline spine tenderness, no crepitance, no stepoff.  Increase in pain with lateral rotation, flexion and extension.  Positive straight leg raise, left greater than right.   Neurological: He is alert and oriented to person, place, and time.  Patelllar DTR intact and 2+ bilaterally, no foot drops, 5/5 strength in lower extremities  Skin: Skin is warm and dry.  Psychiatric: He has a normal mood and affect. His behavior is normal.    ED Course  Procedures (including critical care time) DIAGNOSTIC STUDIES: Oxygen Saturation is 96% on room air, adequate by my interpretation.    COORDINATION OF CARE: 10:32 PM- Patient informed of clinical course including steroids, pain medication, muscle relaxers, and chest XR.   Pt understands medical decision-making process and agrees with plan.  Upon review of prior imaging pt has a chest CTA on 02/26/2012 which shows small nodules in right lung.  Recommend one-year follow-up CT.  Since this is less than one year we will obtain chest XR only.  Informed pt of this decision and he understands and agrees.  11:27 PM CXR is unremarkable.  Will treat for sciatica pain.  Recommend obtain chest CT in august.  Pt voice understanding and agrees with plan.  Due to the duration of the sxs, i do not suspect PE at this time.  Tachycardia likely 2/2 to pain.  Return precaution discussed.   No results found.   No diagnosis found.  BP 128/91  Pulse 109  Temp(Src) 97.1 F (36.2 C) (Oral)  Resp 16  SpO2 99%  I have reviewed nursing notes and vital signs. I personally reviewed the imaging tests through PACS system  I reviewed available ER/hospitalization records thought the EMR  1. Sciatica, L  MDM   I  personally performed the services described in this documentation, which was scribed in my presence. The recorded information has been reviewed and is accurate.         Fayrene Helper, PA-C 09/14/12 2330

## 2012-09-14 NOTE — ED Notes (Signed)
Pt has a hx of sciatica; pt states the pain is similar to his prior episodes of sciatica but that the pain is more worse; states that when he was at [employer] today he felt his left leg give out and had to use a chair to get around;

## 2012-09-14 NOTE — ED Provider Notes (Signed)
Medical screening examination/treatment/procedure(s) were performed by non-physician practitioner and as supervising physician I was immediately available for consultation/collaboration.  Ethelda Chick, MD 09/14/12 [phone]

## 2012-11-19 ENCOUNTER — Emergency Department (HOSPITAL_COMMUNITY)
Admission: EM | Admit: 2012-11-19 | Discharge: 2012-11-19 | Disposition: A | Discharge status: Home / Self Care | Payer: Self-pay | Attending: Emergency Medicine | Admitting: Emergency Medicine

## 2012-11-19 ENCOUNTER — Encounter (HOSPITAL_COMMUNITY): Payer: Self-pay | Admitting: Emergency Medicine

## 2012-11-19 ENCOUNTER — Emergency Department (HOSPITAL_COMMUNITY): Payer: Self-pay

## 2012-11-19 DIAGNOSIS — M545 Low back pain, unspecified: Secondary | ICD-10-CM | POA: Insufficient documentation

## 2012-11-19 DIAGNOSIS — F319 Bipolar disorder, unspecified: Secondary | ICD-10-CM | POA: Insufficient documentation

## 2012-11-19 DIAGNOSIS — F411 Generalized anxiety disorder: Secondary | ICD-10-CM | POA: Insufficient documentation

## 2012-11-19 DIAGNOSIS — F17200 Nicotine dependence, unspecified, uncomplicated: Secondary | ICD-10-CM | POA: Insufficient documentation

## 2012-11-19 DIAGNOSIS — M7918 Myalgia, other site: Secondary | ICD-10-CM

## 2012-11-19 DIAGNOSIS — G8929 Other chronic pain: Secondary | ICD-10-CM | POA: Insufficient documentation

## 2012-11-19 DIAGNOSIS — M25519 Pain in unspecified shoulder: Secondary | ICD-10-CM | POA: Insufficient documentation

## 2012-11-19 DIAGNOSIS — Z79899 Other long term (current) drug therapy: Secondary | ICD-10-CM | POA: Insufficient documentation

## 2012-11-19 DIAGNOSIS — M542 Cervicalgia: Secondary | ICD-10-CM | POA: Insufficient documentation

## 2012-11-19 DIAGNOSIS — K219 Gastro-esophageal reflux disease without esophagitis: Secondary | ICD-10-CM | POA: Insufficient documentation

## 2012-11-19 LAB — CBC WITH DIFFERENTIAL/PLATELET
Eosinophils Absolute: 0.1 10*3/uL (ref 0.0–0.7)
Hemoglobin: 16.7 g/dL (ref 13.0–17.0)
Lymphs Abs: 1.2 10*3/uL (ref 0.7–4.0)
MCH: 32.1 pg (ref 26.0–34.0)
Monocytes Relative: 3 % (ref 3–12)
Neutro Abs: 5.1 10*3/uL (ref 1.7–7.7)
Neutrophils Relative %: 77 % (ref 43–77)
Platelets: 199 10*3/uL (ref 150–400)
RBC: 5.2 MIL/uL (ref 4.22–5.81)
WBC: 6.7 10*3/uL (ref 4.0–10.5)

## 2012-11-19 LAB — BASIC METABOLIC PANEL WITH GFR
BUN: 13 mg/dL (ref 6–23)
CO2: 27 meq/L (ref 19–32)
Calcium: 9.6 mg/dL (ref 8.4–10.5)
Chloride: 103 meq/L (ref 96–112)
Creatinine, Ser: 1.01 mg/dL (ref 0.50–1.35)
GFR calc Af Amer: >90 mL/min
GFR calc non Af Amer: >90 mL/min
Glucose, Bld: 120 mg/dL — ABNORMAL HIGH (ref 70–99)
Potassium: 3.7 meq/L (ref 3.5–5.1)
Sodium: 140 meq/L (ref 135–145)

## 2012-11-19 MED ORDER — CYCLOBENZAPRINE HCL 10 MG PO TABS: 10.0000 mg | ORAL_TABLET | Freq: Two times a day (BID) | ORAL | Status: DC | PRN

## 2012-11-19 MED ORDER — OXYCODONE-ACETAMINOPHEN 5-325 MG PO TABS
1.0000 | ORAL_TABLET | Freq: Once | ORAL | Status: AC
  Administered 2012-11-19: 1 via ORAL
  Filled 2012-11-19: qty 1

## 2012-11-19 MED ORDER — TRAMADOL HCL 50 MG PO TABS: 50.0000 mg | ORAL_TABLET | Freq: Four times a day (QID) | ORAL | Status: DC | PRN

## 2012-11-19 MED ORDER — PREDNISONE 20 MG PO TABS
60.0000 mg | ORAL_TABLET | Freq: Once | ORAL | Status: AC
  Administered 2012-11-19: 60 mg via ORAL
  Filled 2012-11-19: qty 3

## 2012-11-19 MED ORDER — CYCLOBENZAPRINE HCL 10 MG PO TABS
10.0000 mg | ORAL_TABLET | Freq: Once | ORAL | Status: AC
  Administered 2012-11-19: 10 mg via ORAL
  Filled 2012-11-19: qty 1

## 2012-11-19 NOTE — ED Notes (Signed)
Pt reports has history of back pain. Pt c/o neck and shoulder pain x 3 days. Pt reports doing yard work recently. Pain shoots down left arm. Pt has not taken anything for pain today. Pt has tried ibuprofen and aleve past 2 days without relief.

## 2012-11-19 NOTE — ED Provider Notes (Signed)
Medical screening examination/treatment/procedure(s) were performed by non-physician practitioner and as supervising physician I was immediately available for consultation/collaboration.  Craig Ionescu L Charlita Brian, MD 11/19/12 2203 

## 2012-11-19 NOTE — ED Provider Notes (Signed)
History     CSN: [id number]  Arrival date & time 11/19/12  4098   First MD Initiated Contact with Patient 11/19/12 [phone]      Chief Complaint  Patient presents with  . Neck Pain  . Shoulder Pain    (Consider location/radiation/quality/duration/timing/severity/associated sxs/prior treatment) HPI Comments: 37 y.o. Male with chronic lower back pain presents today complaining of his chronic pain along with neck and shoulder pain for the past three days. Pt denies trauma. Believes it may be related to yard work he had done cleaning up the yard requiring quite a bit of physical labor which is unusual to him. Denies fever, loss of bowel or bladder control, night sweats.   Pt states pain is 6/10 when sitting still, but 10/10 radiating down bilateral shoulder blades when he moves. Pt does not have insurance or PCP so has not followed up as per last visit back in [place] when prescribed narcotic pain meds for his chronic pain condition.   PMHx includes lung nodule found on CT August 2013. Pt has not sought follow up.    Patient is a 37 y.o. male presenting with neck pain and shoulder pain.  Neck Pain Associated symptoms: no chest pain, no fever, no headaches, no numbness and no weakness   Shoulder Pain Associated symptoms include neck pain. Pertinent negatives include no chest pain, diaphoresis, fever, headaches, nausea, numbness, rash, vomiting or weakness.    Past Medical History  Diagnosis Date  . Depression   . Anxiety   . Chronic back pain   . Reflux   . Bipolar 1 disorder   . Chest pain at rest, negative MI, r/o cardiac though anxiety may be component 04/10/2012    Past Surgical History  Procedure Laterality Date  . Appendectomy      Family History  Problem Relation Age of Onset  . Diabetes Mother   . Diabetes Father   . Asthma Sister   . Cancer Maternal Aunt     lung     History  Substance Use Topics  . Smoking status: Current Every Day Smoker -- 0.50 packs/day   Types: Cigarettes  . Smokeless tobacco: Not on file  . Alcohol Use: No      Review of Systems  Constitutional: Negative for fever and diaphoresis.  HENT: Positive for neck pain. Negative for neck stiffness.   Eyes: Negative for visual disturbance.  Respiratory: Negative for apnea, chest tightness and shortness of breath.   Cardiovascular: Negative for chest pain and palpitations.  Gastrointestinal: Negative for nausea, vomiting, diarrhea and constipation.  Genitourinary: Negative for dysuria and flank pain.  Musculoskeletal: Positive for back pain. Negative for gait problem.       Chronic lower back pain  Skin: Negative for rash.  Neurological: Negative for dizziness, weakness, light-headedness, numbness and headaches.    Allergies  Review of patient's allergies indicates no known allergies.  Home Medications   Current Outpatient Rx  Name  Route  Sig  Dispense  Refill  . diazepam (VALIUM) 10 MG tablet   Oral   Take 10 mg by mouth every 6 (six) hours as needed. For anxiety         . ibuprofen (ADVIL,MOTRIN) 800 MG tablet   Oral   Take 800 mg by mouth every 6 (six) hours as needed. For pain         . metoprolol tartrate (LOPRESSOR) 25 MG tablet   Oral   Take 1 tablet (25 mg total) by mouth 2 (two)  times daily.   60 tablet   0   . mirtazapine (REMERON) 15 MG tablet   Oral   Take 15 mg by mouth at bedtime.           Marland Kitchen oxyCODONE-acetaminophen (PERCOCET/ROXICET) 5-325 MG per tablet   Oral   Take 1-2 tablets by mouth every 6 (six) hours as needed for pain.   20 tablet   0   . predniSONE (DELTASONE) 10 MG tablet   Oral   Take 2 tablets (20 mg total) by mouth daily.   15 tablet   0   . ranitidine (ZANTAC) 150 MG tablet   Oral   Take 450-600 mg by mouth 3 (three) times daily. Acid reflux         . risperiDONE (RISPERDAL) 2 MG tablet   Oral   Take 2-4 mg by mouth 2 (two) times daily. Patient takes 1 tablet(2mg ) in the morning and 2 tablets(4mg ) at  bedtime          . sertraline (ZOLOFT) 50 MG tablet   Oral   Take 50 mg by mouth daily.           BP 120/79  Pulse 91  Temp(Src) 98.1 F (36.7 C) (Oral)  Resp 18  SpO2 98%  Physical Exam  Nursing note and vitals reviewed. Constitutional: He is oriented to person, place, and time. He appears well-developed and well-nourished. No distress.  HENT:  Head: Normocephalic and atraumatic.  Eyes: Conjunctivae and EOM are normal.  Neck: Normal range of motion. Neck supple.    No meningeal signs  Cardiovascular: Normal rate, regular rhythm and normal heart sounds.  Exam reveals no gallop and no friction rub.   No murmur heard. Pulmonary/Chest: Effort normal and breath sounds normal. No respiratory distress. He has no wheezes. He has no rales. He exhibits no tenderness.  Abdominal: Soft. Bowel sounds are normal. He exhibits no distension. There is no tenderness. There is no rebound and no guarding.  Musculoskeletal: Normal range of motion. He exhibits no edema and no tenderness.  No step-offs noted on C-spine Full range of motion of the T-spine and L-spine No tenderness to palpation of the spinous processes of the C-spine, T-spine or L-spine Mild tenderness to palpation of the paraspinous muscles Normal strength in upper and lower extremities bilaterally including dorsiflexion and plantar flexion, strong and equal grip strength  Neurological: He is alert and oriented to person, place, and time. No cranial nerve deficit.  Speech is clear and goal oriented, follows commands Sensation normal to light touch and two point discrimination Moves extremities without ataxia, coordination intact Normal gait and balance  Skin: Skin is warm and dry. He is not diaphoretic. No erythema.  Psychiatric:  anxious    ED Course  Procedures (including critical care time)  Labs Reviewed  CBC WITH DIFFERENTIAL - Abnormal; Notable for the following:    MCHC 36.3 (*)    All other components within  normal limits  BASIC METABOLIC PANEL - Abnormal; Notable for the following:    Glucose, Bld 120 (*)    All other components within normal limits   Dg Chest 2 View  11/19/2012  *RADIOLOGY REPORT*  Clinical Data: Chest pain  CHEST - 2 VIEW  Comparison: Chest radiograph 09/14/2012  Findings: Normal mediastinum and cardiac silhouette.  Costophrenic angles are clear.  No effusion, infiltrate, or pneumothorax.  No acute cardiopulmonary process.  IMPRESSION: No acute cardiopulmonary process.   Original Report Authenticated By: Genevive Bi, M.D.    .  1. Musculoskeletal pain       MDM  No new focal deficits. Normal neuro exam. Patient can walk but states is painful.  No loss of bowel or bladder control.  No concern for cauda equina.  No fever, night sweats. PE correlates with hx of yard work a few days ago and seems likely musculoskeletal in nature.   Concerned when pt told me about 30 pound unintentional weight loss over the last month and knowing he did not follow up with regard to his lung nodule. Discussed with Dr. Effie Shy who agreed that CXR, CBC, and chem 8 would be helpful to help guide pt for further follow up if necessary.   CXR and lab work reassuring. Impressed upon the pt the importance of following up even if financial constraints are tight. Provided resource guide and information on Affordable Care Act. Also discussed the treatment of chronic pain in the ED vs outpatient and that resources in that capacity are limited.   Pt acknowledged that he understood and would increase his efforts to follow up with primary care.  At this time there does not appear to be any evidence of an acute emergency medical condition and the patient appears stable for discharge with appropriate outpatient follow up.Diagnosis was discussed with patient who verbalizes understanding and is agreeable to discharge. Pt case discussed with Dr. Effie Shy who agrees with plan.    Glade Nurse, PA-C 11/19/12 1626

## 2013-02-25 ENCOUNTER — Ambulatory Visit: Payer: Self-pay | Admitting: Family Medicine

## 2013-03-01 ENCOUNTER — Ambulatory Visit (INDEPENDENT_AMBULATORY_CARE_PROVIDER_SITE_OTHER): Payer: BC Managed Care – PPO | Admitting: Family Medicine

## 2013-03-01 ENCOUNTER — Encounter: Payer: Self-pay | Admitting: Family Medicine

## 2013-03-01 VITALS — BP 130/90 | HR 78 | Temp 97.5°F | Resp 18 | Ht 71.0 in | Wt 206.0 lb

## 2013-03-01 DIAGNOSIS — I10 Essential (primary) hypertension: Secondary | ICD-10-CM

## 2013-03-01 DIAGNOSIS — F319 Bipolar disorder, unspecified: Secondary | ICD-10-CM

## 2013-03-01 DIAGNOSIS — M549 Dorsalgia, unspecified: Secondary | ICD-10-CM

## 2013-03-01 DIAGNOSIS — R918 Other nonspecific abnormal finding of lung field: Secondary | ICD-10-CM

## 2013-03-01 DIAGNOSIS — R231 Pallor: Secondary | ICD-10-CM

## 2013-03-01 LAB — COMPREHENSIVE METABOLIC PANEL
ALT: 73 U/L — ABNORMAL HIGH (ref 0–53)
AST: 39 U/L — ABNORMAL HIGH (ref 0–37)
Albumin: 4.3 g/dL (ref 3.5–5.2)
CO2: 28 mEq/L (ref 19–32)
Calcium: 9.4 mg/dL (ref 8.4–10.5)
Chloride: 103 mEq/L (ref 96–112)
Potassium: 4.5 mEq/L (ref 3.5–5.3)

## 2013-03-01 LAB — CBC
MCHC: 35.2 g/dL (ref 30.0–36.0)
Platelets: 243 10*3/uL (ref 150–400)
RDW: 13.4 % (ref 11.5–15.5)
WBC: 7.4 10*3/uL (ref 4.0–10.5)

## 2013-03-01 LAB — LIPID PANEL
Cholesterol: 211 mg/dL — ABNORMAL HIGH (ref 0–200)
Triglycerides: 87 mg/dL (ref <150)

## 2013-03-01 MED ORDER — CARISOPRODOL 350 MG PO TABS: 350.0000 mg | ORAL_TABLET | Freq: Every evening | ORAL | Status: DC | PRN

## 2013-03-01 MED ORDER — HYDROCODONE-ACETAMINOPHEN 5-325 MG PO TABS: 1.0000 | ORAL_TABLET | Freq: Four times a day (QID) | ORAL | Status: DC | PRN

## 2013-03-01 NOTE — Patient Instructions (Addendum)
Start pain meds, on pain contract Use the SOMA at bedtime We will call with lab results F/U 2 months for blood pressure

## 2013-03-01 NOTE — Assessment & Plan Note (Signed)
Will plan to have repeat CT scan later this year

## 2013-03-01 NOTE — Progress Notes (Signed)
  Subjective:    Patient ID: Alec Carter, male    DOB: 13-Aug-1975, 37 y.o.   MRN: [id number]  HPI  Patient here to establish care. Previous PCP [facility] which he only saw for a couple of visits. Medications and history reviewed. He was also seen by the internal medicine residency clinic at [place] which I have records for. Chronic back pain he is history of low back pain with radiation down right side status post an injury in 2008 while he was on the job. He had epidural injections but had a reaction to them. In the past was on pain medications until he lost his insurance and was unable to go to a pain clinic. He was also followed at the health department however he was unable to to afford any pain clinic at that time and make it only prescribed him tramadol which did not help. He's had 3 MRIs the last MRI was in March of 2013 which he is a copy from [facility] in [place]. 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 He also has a small disc herniation at L5-S1 and some mild nerve or irritation at L5 on the left side  He has a history of bipolar disorder. He is no longer being seen by psychiatry in the past he was on multiple medications including Depakote, Lamictal, Respinol, Zoloft. He went off his medications a year ago states he feels better off this medication denies feeling manic or depressed. States his depression and anxiety worsened when he was out of work and had no insurance. He's been working at [employer] for the past 5 months and things have been well.  Hypertension history of hypertension he was on metoprolol twice a day which was prescribed when he was admitted last August secondary to chest pain palpitations and anxiety. He has changed his diet and has lost about 40 pounds intentionally.  Lung nodules a CT of chest during his admission last year showed right-sided lung nodules  he has not had any workup for this. He states he quit smoking about months ago. 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 since he had an injury where ia piece of wood was caught in his eye at age 53  Review of Systems  GEN- denies fatigue, fever, weight loss,weakness, recent illness HEENT- denies eye drainage, change in vision, nasal discharge, CVS- denies chest pain, palpitations RESP- denies SOB, cough, wheeze ABD- denies N/V, change in stools, abd pain GU- denies dysuria, hematuria, dribbling, incontinence MSK- + joint pain, muscle aches, injury Neuro- denies headache, dizziness, syncope, seizure activity      Objective:   Physical Exam GEN- NAD, alert and oriented x3 HEENT- Right eye reactive, left pupil non reactive, EOMI, non injected sclera, pink conjunctiva, MMM, oropharynx clear Neck- Supple, FROM CVS- RRR, no murmur RESP-CTAB ABD-NABS,soft,NT,ND MSK- Spine TTP lumbar spine, neg SLR, +paraspinal spasm Lower back, FROM bilat Hips NEURO-CNII-XII in tact, no focal deficits, sensation and motor in tact  EXT- No edema Pulses- Radial, DP- 2+ Skin- lacey erythematous across lower back Psych- normal affect and mood      Assessment & Plan:

## 2013-03-01 NOTE — Assessment & Plan Note (Signed)
This is her first visit. We'll have to monitor his symptoms. He's been off of all psychiatric medications for the past year. I've advised him if I notice any changes especially with his use of his pain medication he will need to be referred to psychiatry

## 2013-03-01 NOTE — Assessment & Plan Note (Signed)
Diastolic blood pressure elevated some today we will recheck this in 2 months she will have fasting labs done today

## 2013-03-01 NOTE — Assessment & Plan Note (Signed)
Chronic low back pain secondary to disc bulge and degenerative changes. He has been started on a pain contract today as I do have his medical records and have them reviewed. He's been given 60 tablets of hydrocodone acetaminophen and he is to use soma at bedtime

## 2013-03-01 NOTE — Assessment & Plan Note (Signed)
Noted on lower back

## 2013-03-02 ENCOUNTER — Other Ambulatory Visit: Payer: Self-pay | Admitting: Family Medicine

## 2013-03-02 DIAGNOSIS — R7989 Other specified abnormal findings of blood chemistry: Secondary | ICD-10-CM

## 2013-03-08 ENCOUNTER — Ambulatory Visit (HOSPITAL_COMMUNITY)
Admission: RE | Admit: 2013-03-08 | Discharge: 2013-03-08 | Disposition: A | Discharge status: Home / Self Care | Payer: BC Managed Care – PPO | Source: Ambulatory Visit | Attending: Family Medicine | Admitting: Family Medicine

## 2013-03-08 DIAGNOSIS — R7989 Other specified abnormal findings of blood chemistry: Secondary | ICD-10-CM | POA: Insufficient documentation

## 2013-03-08 DIAGNOSIS — K7689 Other specified diseases of liver: Secondary | ICD-10-CM | POA: Insufficient documentation

## 2013-03-10 ENCOUNTER — Other Ambulatory Visit: Payer: Self-pay | Admitting: Family Medicine

## 2013-03-10 MED ORDER — SIMVASTATIN 10 MG PO TABS: 10.0000 mg | ORAL_TABLET | Freq: Every day | ORAL | Status: DC

## 2013-03-17 ENCOUNTER — Other Ambulatory Visit: Payer: Self-pay | Admitting: Family Medicine

## 2013-03-18 NOTE — Telephone Encounter (Signed)
Can not get until until 03/31/13 , 30 days from the previous prescription per his pain contract

## 2013-03-28 ENCOUNTER — Telehealth: Payer: Self-pay | Admitting: Family Medicine

## 2013-03-28 ENCOUNTER — Other Ambulatory Visit: Payer: Self-pay | Admitting: Family Medicine

## 2013-03-28 NOTE — Telephone Encounter (Signed)
Refills Soma 350mg  and Hydrocodone 5-325mg , pt states he is aware that he is early but wanted to call in today and pick up on from pharmacy on Friday.

## 2013-03-29 NOTE — Telephone Encounter (Signed)
Pt is aware has to come in to pick up prescriptions

## 2013-03-29 NOTE — Telephone Encounter (Signed)
He must come in to get prescription, he can not get until Friday, he is on a pain contract, given a copy of it during our last visit.

## 2013-03-31 ENCOUNTER — Telehealth: Payer: Self-pay | Admitting: Family Medicine

## 2013-03-31 ENCOUNTER — Other Ambulatory Visit: Payer: Self-pay | Admitting: Family Medicine

## 2013-03-31 MED ORDER — HYDROCODONE-ACETAMINOPHEN 5-325 MG PO TABS: 1.0000 | ORAL_TABLET | Freq: Four times a day (QID) | ORAL | Status: DC | PRN

## 2013-03-31 MED ORDER — CARISOPRODOL 350 MG PO TABS: 350.0000 mg | ORAL_TABLET | Freq: Every evening | ORAL | Status: DC | PRN

## 2013-03-31 NOTE — Telephone Encounter (Signed)
Soma 350 mg tab 1 QHS prn muscle spasms #30 last rf 03/01/13

## 2013-03-31 NOTE — Telephone Encounter (Signed)
Med ok per Dr. Jeanice Lim and printed out and pt is to pick up Friday

## 2013-04-28 ENCOUNTER — Other Ambulatory Visit: Payer: Self-pay | Admitting: Family Medicine

## 2013-04-29 MED ORDER — HYDROCODONE-ACETAMINOPHEN 5-325 MG PO TABS: 1.0000 | ORAL_TABLET | Freq: Four times a day (QID) | ORAL | Status: DC | PRN

## 2013-04-29 MED ORDER — CARISOPRODOL 350 MG PO TABS: 350.0000 mg | ORAL_TABLET | Freq: Every evening | ORAL | Status: DC | PRN

## 2013-04-29 NOTE — Telephone Encounter (Signed)
Last refills 03/31/13  OK refill?

## 2013-04-29 NOTE — Telephone Encounter (Signed)
Pt aware Rx ready for pick up at front desk

## 2013-04-29 NOTE — Telephone Encounter (Signed)
Okay 

## 2013-05-02 ENCOUNTER — Ambulatory Visit: Payer: BC Managed Care – PPO | Admitting: Family Medicine

## 2013-05-18 ENCOUNTER — Encounter: Payer: Self-pay | Admitting: Family Medicine

## 2013-05-18 ENCOUNTER — Ambulatory Visit (INDEPENDENT_AMBULATORY_CARE_PROVIDER_SITE_OTHER): Payer: BC Managed Care – PPO | Admitting: Family Medicine

## 2013-05-18 VITALS — BP 130/80 | HR 98 | Temp 98.5°F | Resp 24 | Wt 220.0 lb

## 2013-05-18 DIAGNOSIS — K7689 Other specified diseases of liver: Secondary | ICD-10-CM

## 2013-05-18 DIAGNOSIS — Z87891 Personal history of nicotine dependence: Secondary | ICD-10-CM

## 2013-05-18 DIAGNOSIS — M549 Dorsalgia, unspecified: Secondary | ICD-10-CM

## 2013-05-18 DIAGNOSIS — K760 Fatty (change of) liver, not elsewhere classified: Secondary | ICD-10-CM

## 2013-05-18 DIAGNOSIS — Z79899 Other long term (current) drug therapy: Secondary | ICD-10-CM

## 2013-05-18 DIAGNOSIS — R918 Other nonspecific abnormal finding of lung field: Secondary | ICD-10-CM

## 2013-05-18 DIAGNOSIS — Z9229 Personal history of other drug therapy: Secondary | ICD-10-CM

## 2013-05-18 DIAGNOSIS — R748 Abnormal levels of other serum enzymes: Secondary | ICD-10-CM

## 2013-05-18 DIAGNOSIS — I10 Essential (primary) hypertension: Secondary | ICD-10-CM

## 2013-05-18 LAB — COMPREHENSIVE METABOLIC PANEL
AST: 37 U/L (ref 0–37)
Alkaline Phosphatase: 92 U/L (ref 39–117)
Glucose, Bld: 73 mg/dL (ref 70–99)
Sodium: 138 mEq/L (ref 135–145)
Total Bilirubin: 0.4 mg/dL (ref 0.3–1.2)
Total Protein: 7.1 g/dL (ref 6.0–8.3)

## 2013-05-18 MED ORDER — CARISOPRODOL 350 MG PO TABS: 350.0000 mg | ORAL_TABLET | Freq: Every evening | ORAL | Status: DC | PRN

## 2013-05-18 MED ORDER — HYDROCODONE-ACETAMINOPHEN 5-325 MG PO TABS: 1.0000 | ORAL_TABLET | Freq: Four times a day (QID) | ORAL | Status: DC | PRN

## 2013-05-18 NOTE — Patient Instructions (Addendum)
Continue current medications We will call with lab results Get the cholesterol at end of November  CT of chest to be set up F/U 4 months

## 2013-05-21 NOTE — Assessment & Plan Note (Signed)
Plan to recheck FLP in November Discussed ETOH intake- needs to limit or better abstain

## 2013-05-21 NOTE — Assessment & Plan Note (Signed)
CT of chest to ensure stability

## 2013-05-21 NOTE — Assessment & Plan Note (Signed)
Check CMET. 

## 2013-05-21 NOTE — Assessment & Plan Note (Signed)
UDS  Meds refilled

## 2013-05-21 NOTE — Assessment & Plan Note (Signed)
BP looks good , no BP meds

## 2013-05-21 NOTE — Progress Notes (Signed)
  Subjective:    Patient ID: Alec Carter, male    DOB: 1976/05/29, 37 y.o.   MRN: [id number]  HPI  Pt here to f/u chronic medical problems. Injured his thumb a few weeks ago, did not seek care, no current pain but still swollen.  Chronic pain- due for refill on pain medications Bipolar- states he is doing well, no mood swings, no difficulty sleeping, work and family life is stable Fatty liver- due for repeat FLP Lung nodules- due for repeat CT of chest multiple lung nodules seen on scan 2013  Review of Systems - per above GEN- denies fatigue, fever, weight loss,weakness, recent illness HEENT- denies eye drainage, change in vision, nasal discharge, CVS- denies chest pain, palpitations RESP- denies SOB, cough, wheeze ABD- denies N/V, change in stools, abd pain GU- denies dysuria, hematuria, dribbling, incontinence MSK- denies joint pain, muscle aches,+ injury Neuro- denies headache, dizziness, syncope, seizure activity       Objective:   Physical Exam GEN- NAD, alert and oriented x3 HEENT- PERRL, EOMI, non injected sclera, pink conjunctiva, MMM, oropharynx clear CVS- RRR, no murmur RESP-CTAB MSK- Left hand - thumb mild swelling at DIP, NT, normal ROM finger/ hand, able to make fist EXT- No edema Pulses- Radial 2+ Psych- normal affect and mood       Assessment & Plan:

## 2013-05-23 LAB — OPIATES/OPIOIDS (LC/MS-MS)
Codeine Urine: NEGATIVE ng/mL
Heroin (6-AM), UR: NEGATIVE ng/mL
Hydrocodone: 739 ng/mL — AB
Hydromorphone: 163 ng/mL — AB
Norhydrocodone, Ur: 686 ng/mL — AB
Noroxycodone, Ur: NEGATIVE ng/mL
Oxycodone, ur: NEGATIVE ng/mL

## 2013-05-23 LAB — BENZODIAZEPINES (GC/LC/MS), URINE
Alprazolam metabolite (GC/LC/MS), ur confirm: 110 ng/mL — AB
Diazepam (GC/LC/MS), ur confirm: NEGATIVE ng/mL
Midazolam (GC/LC/MS), ur confirm: NEGATIVE ng/mL
Oxazepam (GC/LC/MS), ur confirm: NEGATIVE ng/mL
Triazolam metabolite (GC/LC/MS), ur confirm: NEGATIVE ng/mL

## 2013-05-24 LAB — PRESCRIPTION MONITORING PROFILE (13 PANEL)
Amphetamine/Meth: NEGATIVE ng/mL
Barbiturate Screen, Urine: NEGATIVE ng/mL
Buprenorphine, Urine: NEGATIVE ng/mL
Fentanyl, Ur: NEGATIVE ng/mL
Meperidine, Ur: NEGATIVE ng/mL
Nitrites, Initial: NEGATIVE ug/mL
Oxycodone Screen, Ur: NEGATIVE ng/mL

## 2013-05-26 ENCOUNTER — Other Ambulatory Visit: Payer: Self-pay

## 2013-06-01 ENCOUNTER — Other Ambulatory Visit (HOSPITAL_COMMUNITY): Payer: BC Managed Care – PPO

## 2013-06-10 ENCOUNTER — Ambulatory Visit (INDEPENDENT_AMBULATORY_CARE_PROVIDER_SITE_OTHER): Payer: BC Managed Care – PPO | Admitting: Family Medicine

## 2013-06-10 ENCOUNTER — Encounter: Payer: Self-pay | Admitting: Family Medicine

## 2013-06-10 ENCOUNTER — Ambulatory Visit (HOSPITAL_COMMUNITY)
Admission: RE | Admit: 2013-06-10 | Discharge: 2013-06-10 | Disposition: A | Discharge status: Home / Self Care | Payer: BC Managed Care – PPO | Source: Ambulatory Visit | Attending: Family Medicine | Admitting: Family Medicine

## 2013-06-10 VITALS — BP 138/88 | HR 84 | Temp 97.8°F | Resp 18 | Ht 73.0 in | Wt 224.0 lb

## 2013-06-10 DIAGNOSIS — G894 Chronic pain syndrome: Secondary | ICD-10-CM

## 2013-06-10 DIAGNOSIS — G4700 Insomnia, unspecified: Secondary | ICD-10-CM

## 2013-06-10 DIAGNOSIS — R911 Solitary pulmonary nodule: Secondary | ICD-10-CM | POA: Insufficient documentation

## 2013-06-10 DIAGNOSIS — Z87891 Personal history of nicotine dependence: Secondary | ICD-10-CM

## 2013-06-10 DIAGNOSIS — R918 Other nonspecific abnormal finding of lung field: Secondary | ICD-10-CM | POA: Insufficient documentation

## 2013-06-10 MED ORDER — TRAZODONE HCL 50 MG PO TABS: ORAL_TABLET | ORAL | Status: DC

## 2013-06-10 MED ORDER — HYDROCODONE-ACETAMINOPHEN 5-325 MG PO TABS: 1.0000 | ORAL_TABLET | Freq: Four times a day (QID) | ORAL | Status: DC | PRN

## 2013-06-10 NOTE — Patient Instructions (Signed)
Try the trazodone for sleep  Pain medication can be filled on 11/28 F/U as previous

## 2013-06-11 ENCOUNTER — Encounter: Payer: Self-pay | Admitting: Family Medicine

## 2013-06-11 DIAGNOSIS — G894 Chronic pain syndrome: Secondary | ICD-10-CM | POA: Insufficient documentation

## 2013-06-11 DIAGNOSIS — G4700 Insomnia, unspecified: Secondary | ICD-10-CM | POA: Insufficient documentation

## 2013-06-11 NOTE — Assessment & Plan Note (Addendum)
He'll be continued on his pain medication regimen. Advised him that he sign a pain contract and even though his wife is a patient of mine he cannot use her medications either. He voiced understanding he has any further difficulties with his drug screen contract will be discontinued. Of note he did bring in old bottle which was empty of oxycodone which he had taken before he started on a pain contract states that he also took a Valium which he had left over and have the bottle for this as well before the pain contract.

## 2013-06-11 NOTE — Progress Notes (Signed)
  Subjective:    Patient ID: Alec Carter, male    DOB: [birth year], 37 y.o.   MRN: [id number]  HPI  patient here due to failed drug screen. He is currently prescribed hydrocodone acetaminophen which did show up in his drug screen however he was also positive for Xanax which is he has not prescribed. He tells me that he took a few of his wife's Xanax and which she is also my patient and his prescribe this medication. States he's been having difficulty sleeping on and off for the past couple weeks. When asked about his bipolar he states that his mood is good and he does not have any manic symptoms however he has difficulty falling asleep. He's asking for something to help his sleep.   Review of Systems - per above GEN- denies fatigue, fever, weight loss,weakness, recent illness       Objective:   Physical Exam  GEN-NAD,alert and oriented x 3 Psych- normal affect and mood, good eye contact, not anxious appearing, normal speech, no response to external stimuli      Assessment & Plan:

## 2013-06-11 NOTE — Assessment & Plan Note (Signed)
I will give him a trial of trazodone.  Our next option is  Seroquel secondary to his history of bipolar disease. I would like to avoid benzodiazepines

## 2013-07-11 ENCOUNTER — Other Ambulatory Visit: Payer: Self-pay | Admitting: Family Medicine

## 2013-07-11 MED ORDER — HYDROCODONE-ACETAMINOPHEN 5-325 MG PO TABS: 1.0000 | ORAL_TABLET | Freq: Four times a day (QID) | ORAL | Status: DC | PRN

## 2013-07-11 MED ORDER — CARISOPRODOL 350 MG PO TABS: 350.0000 mg | ORAL_TABLET | Freq: Every evening | ORAL | Status: DC | PRN

## 2013-07-11 NOTE — Telephone Encounter (Signed)
Scripts printed ready for dr.approval and pt aware via email

## 2013-08-09 ENCOUNTER — Other Ambulatory Visit: Payer: Self-pay | Admitting: Family Medicine

## 2013-08-09 MED ORDER — HYDROCODONE-ACETAMINOPHEN 5-325 MG PO TABS: 1.0000 | ORAL_TABLET | Freq: Four times a day (QID) | ORAL | Status: DC | PRN

## 2013-08-09 NOTE — Telephone Encounter (Signed)
Last Rf 12/22 was flagged Do Not Fill Until 12/27.  #60  OK refill?

## 2013-08-09 NOTE — Telephone Encounter (Signed)
Okay to refill? 

## 2013-08-10 ENCOUNTER — Other Ambulatory Visit: Payer: Self-pay | Admitting: Family Medicine

## 2013-08-10 ENCOUNTER — Telehealth: Payer: Self-pay | Admitting: *Deleted

## 2013-08-10 MED ORDER — HYDROCODONE-ACETAMINOPHEN 5-325 MG PO TABS: 1.0000 | ORAL_TABLET | Freq: Four times a day (QID) | ORAL | Status: DC | PRN

## 2013-08-10 NOTE — Telephone Encounter (Signed)
Script printed ready for dr. Approval and pt to pic up

## 2013-08-16 ENCOUNTER — Other Ambulatory Visit: Payer: Self-pay | Admitting: Family Medicine

## 2013-08-16 NOTE — Telephone Encounter (Signed)
These were done last week.  Soma went to pharmacy.  Hydrocodone here waiting for pt to pick up.  Pt has been made aware.

## 2013-10-14 ENCOUNTER — Ambulatory Visit: Payer: BC Managed Care – PPO | Admitting: Family Medicine

## 2014-02-26 IMAGING — CR DG NASAL BONES 3+V
3 series · 3 of 3 positions shown · non-contrast
Comparison: None.

CLINICAL DATA: Hit nose walking into a door jam

NASAL BONES - 3+ VIEW

[view not recorded (1 of 3)]
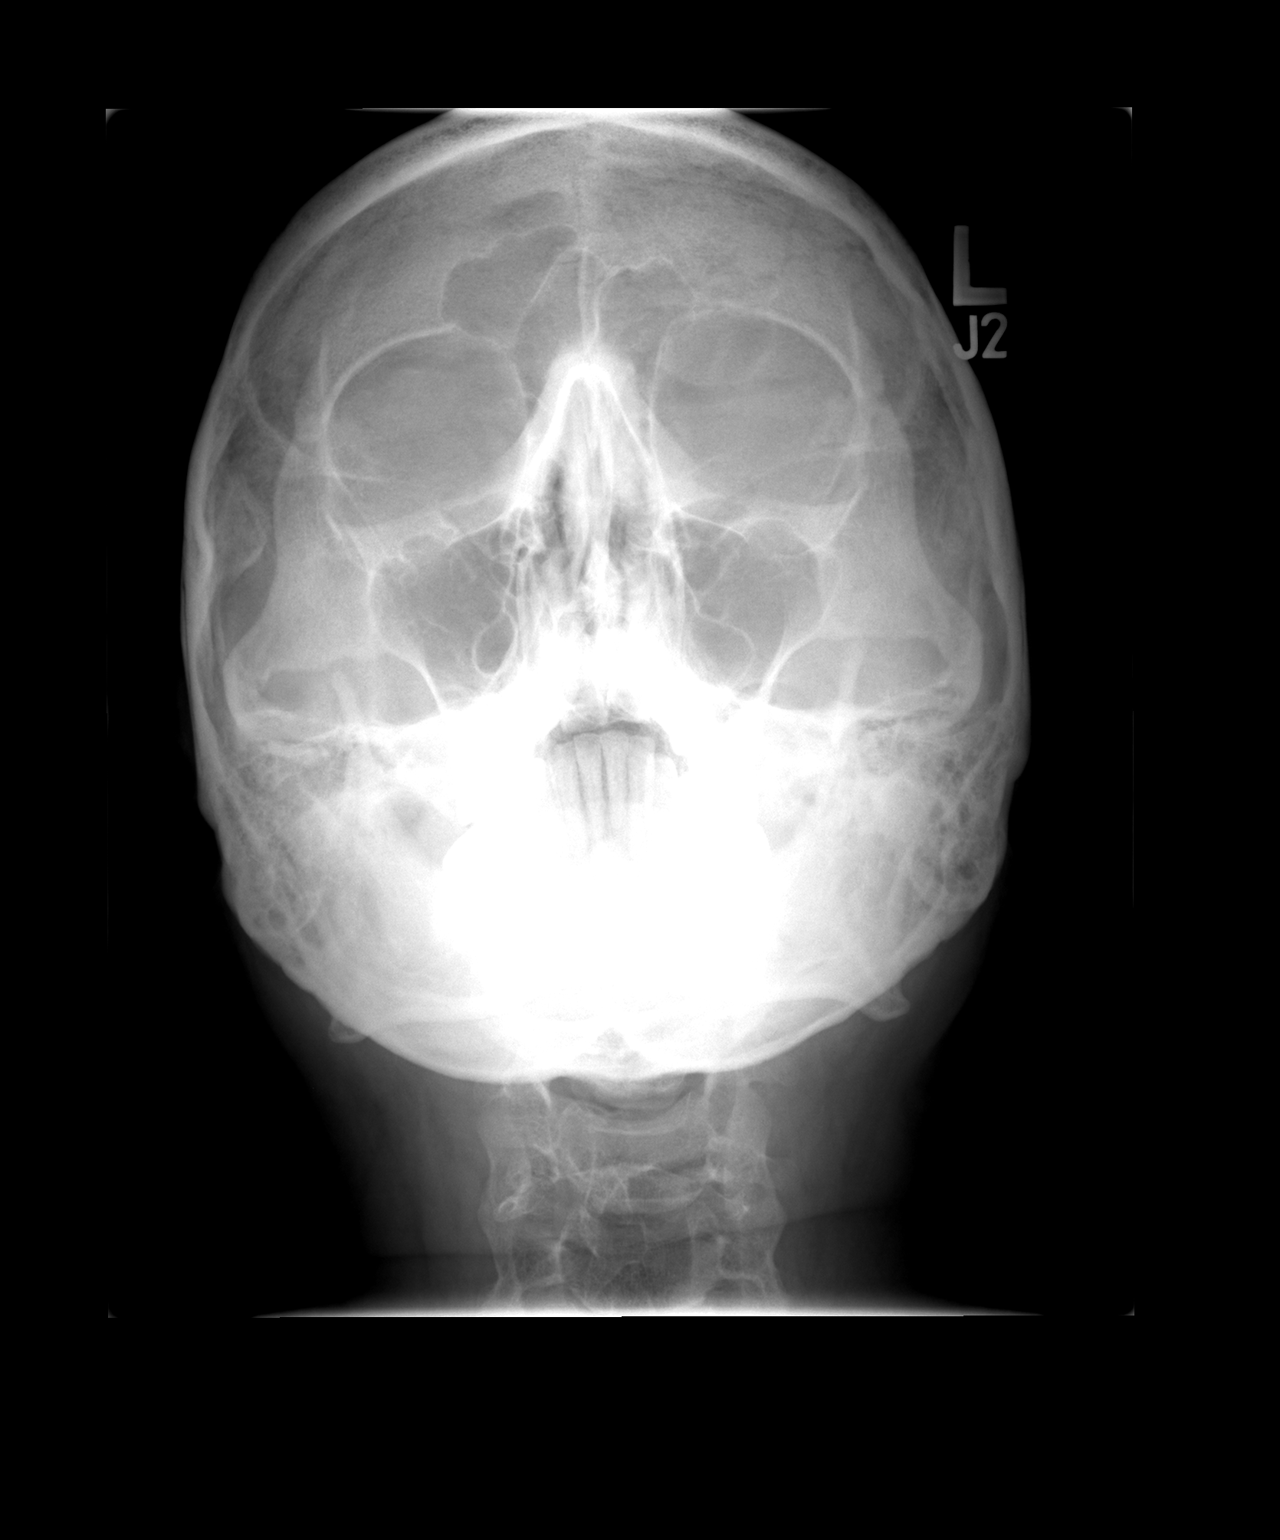

[view not recorded (2 of 3)]
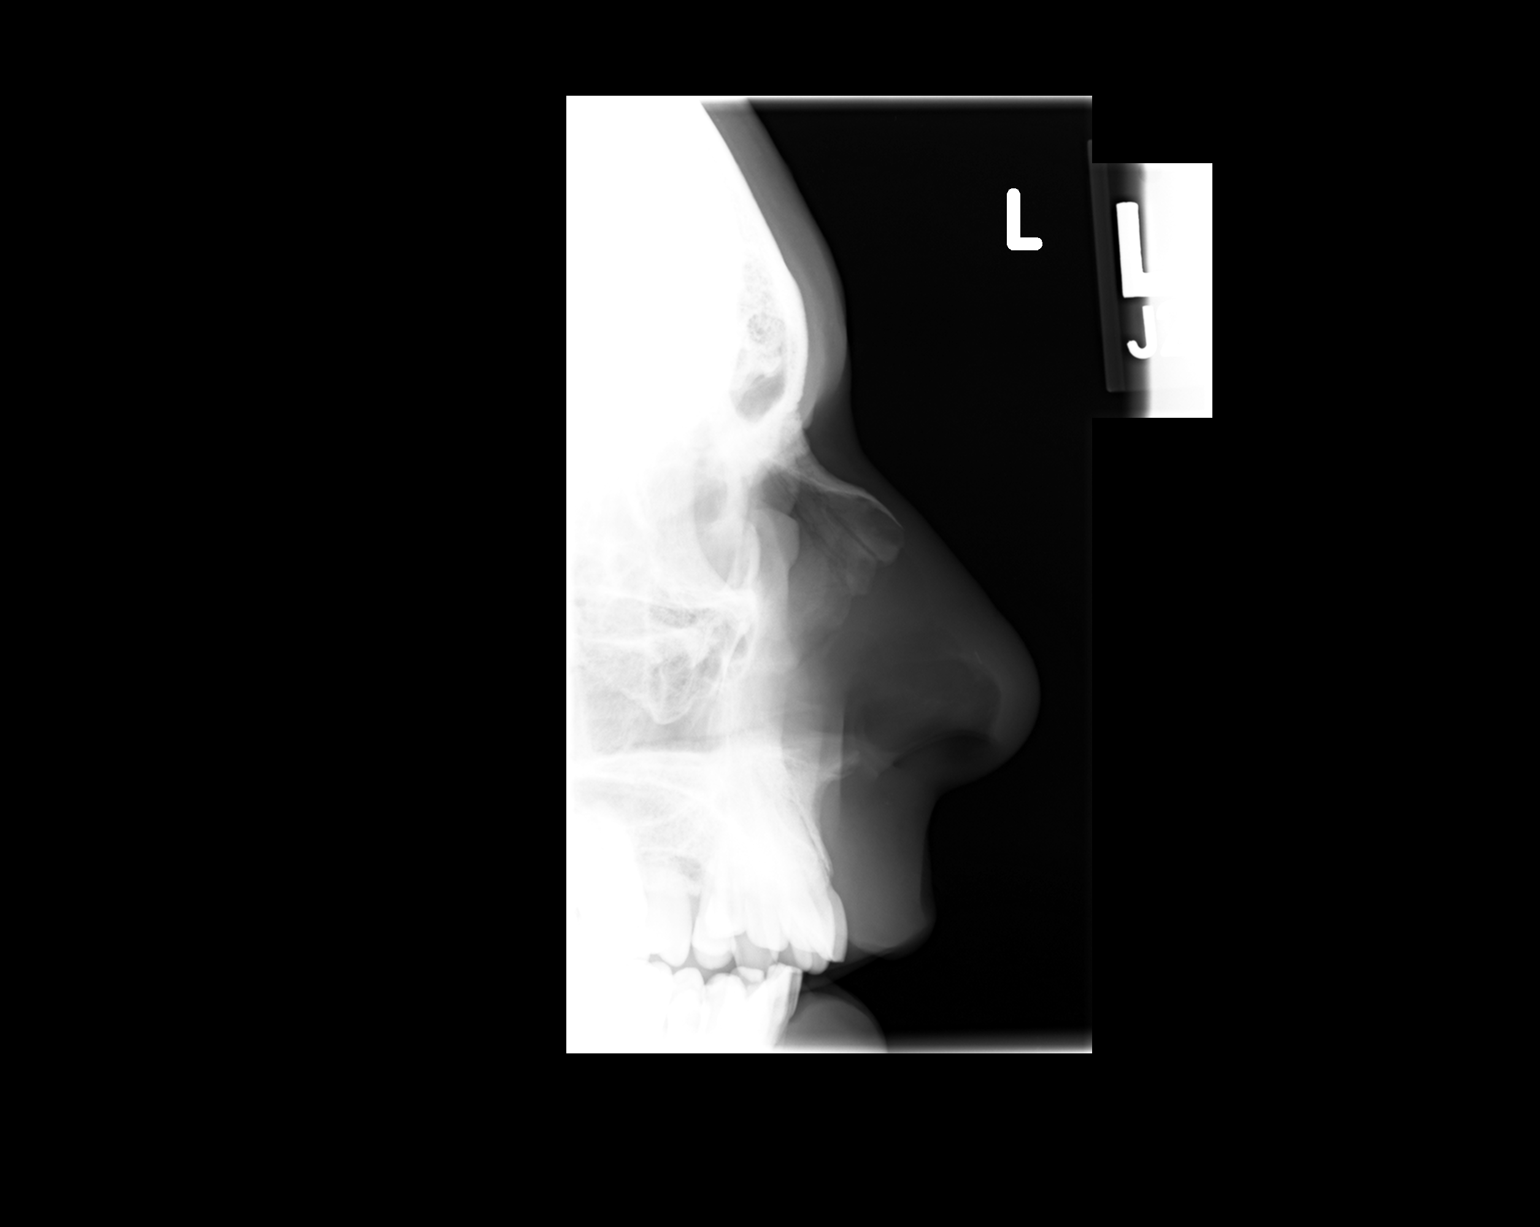

[view not recorded (3 of 3)]
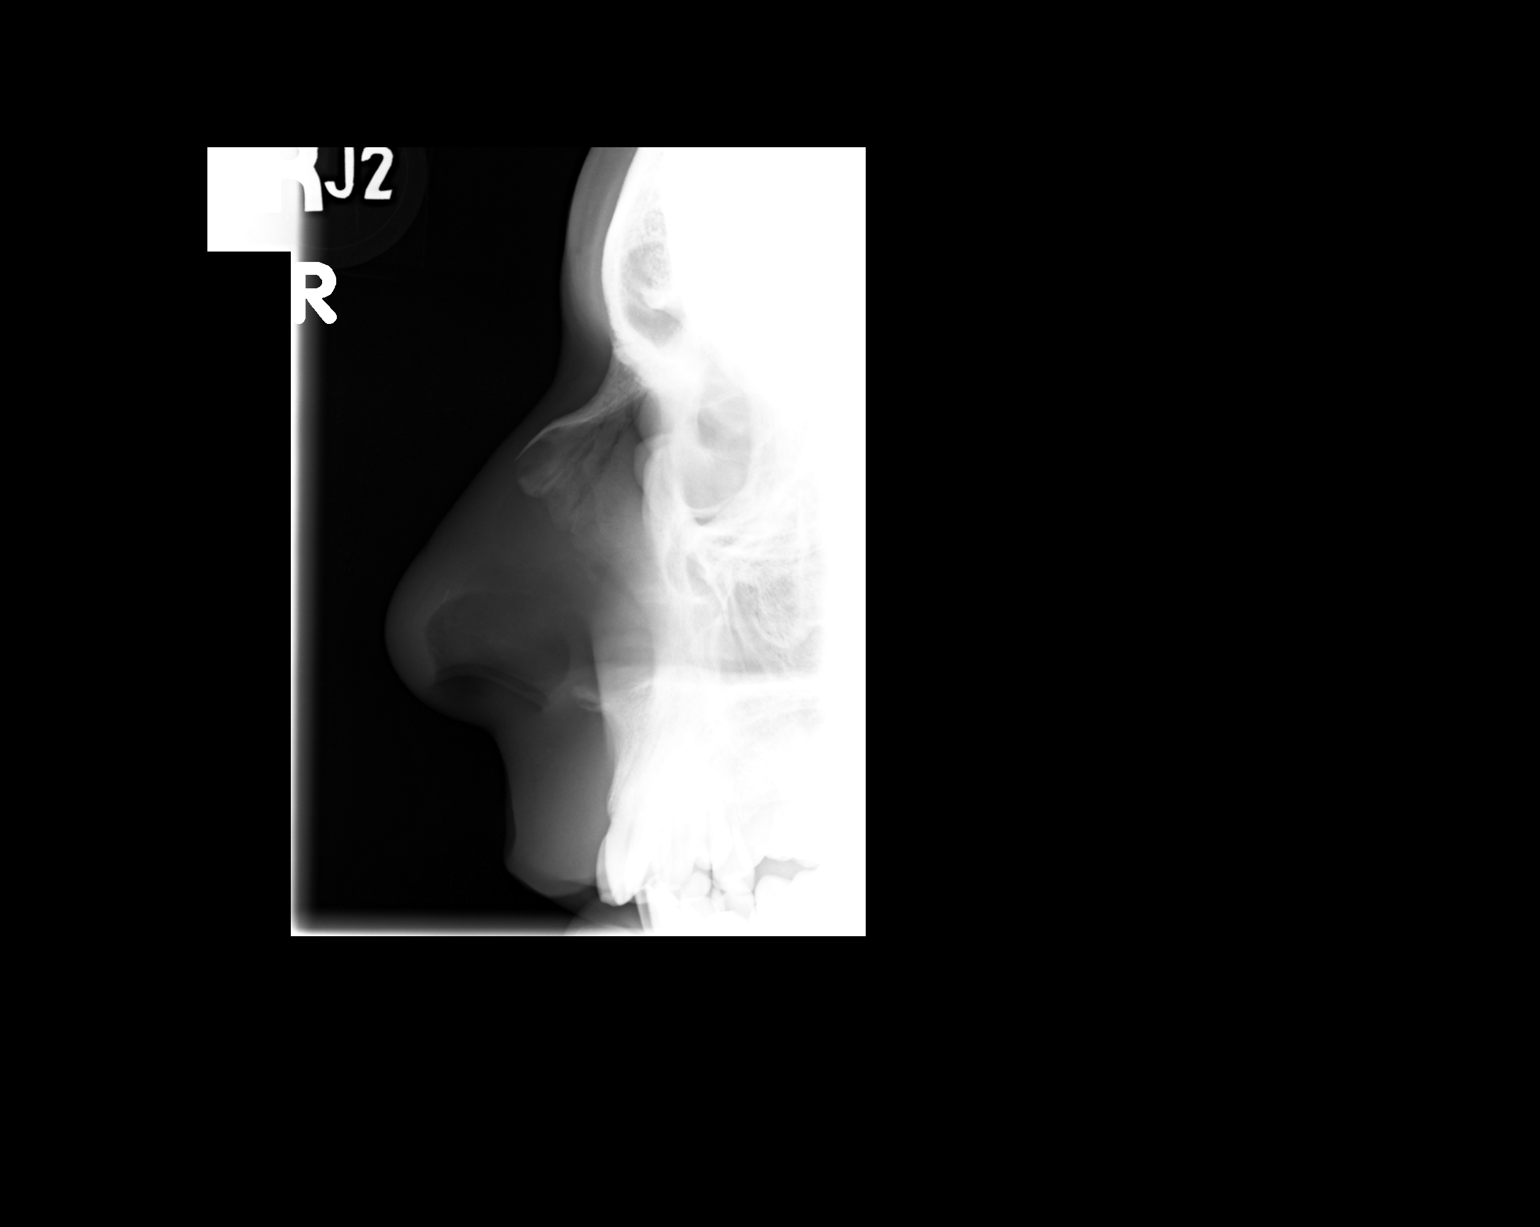

[3 of 3 positions shown; findings below may reference images not displayed]

FINDINGS: No nasal bone fracture is seen.  The nasal spine appears
intact.  The paranasal sinuses are clear.
IMPRESSION: No nasal bone fracture.

## 2014-03-26 DIAGNOSIS — F3289 Other specified depressive episodes: Secondary | ICD-10-CM | POA: Diagnosis not present

## 2014-03-26 DIAGNOSIS — S99929A Unspecified injury of unspecified foot, initial encounter: Secondary | ICD-10-CM | POA: Diagnosis present

## 2014-03-26 DIAGNOSIS — G8929 Other chronic pain: Secondary | ICD-10-CM | POA: Diagnosis not present

## 2014-03-26 DIAGNOSIS — I10 Essential (primary) hypertension: Secondary | ICD-10-CM | POA: Diagnosis not present

## 2014-03-26 DIAGNOSIS — Z87891 Personal history of nicotine dependence: Secondary | ICD-10-CM | POA: Diagnosis not present

## 2014-03-26 DIAGNOSIS — H5440 Blindness, one eye, unspecified eye: Secondary | ICD-10-CM | POA: Diagnosis not present

## 2014-03-26 DIAGNOSIS — F411 Generalized anxiety disorder: Secondary | ICD-10-CM | POA: Insufficient documentation

## 2014-03-26 DIAGNOSIS — Z79899 Other long term (current) drug therapy: Secondary | ICD-10-CM | POA: Insufficient documentation

## 2014-03-26 DIAGNOSIS — Y9289 Other specified places as the place of occurrence of the external cause: Secondary | ICD-10-CM | POA: Insufficient documentation

## 2014-03-26 DIAGNOSIS — S92919A Unspecified fracture of unspecified toe(s), initial encounter for closed fracture: Secondary | ICD-10-CM | POA: Insufficient documentation

## 2014-03-26 DIAGNOSIS — F329 Major depressive disorder, single episode, unspecified: Secondary | ICD-10-CM | POA: Insufficient documentation

## 2014-03-26 DIAGNOSIS — S8990XA Unspecified injury of unspecified lower leg, initial encounter: Secondary | ICD-10-CM | POA: Insufficient documentation

## 2014-03-26 DIAGNOSIS — K219 Gastro-esophageal reflux disease without esophagitis: Secondary | ICD-10-CM | POA: Insufficient documentation

## 2014-03-26 DIAGNOSIS — Y936A Activity, physical games generally associated with school recess, summer camp and children: Secondary | ICD-10-CM | POA: Diagnosis not present

## 2014-03-26 DIAGNOSIS — M549 Dorsalgia, unspecified: Secondary | ICD-10-CM | POA: Insufficient documentation

## 2014-03-26 DIAGNOSIS — S99919A Unspecified injury of unspecified ankle, initial encounter: Secondary | ICD-10-CM

## 2014-03-26 DIAGNOSIS — W219XXA Striking against or struck by unspecified sports equipment, initial encounter: Secondary | ICD-10-CM | POA: Insufficient documentation

## 2014-03-27 ENCOUNTER — Emergency Department (HOSPITAL_COMMUNITY)
Admission: EM | Admit: 2014-03-27 | Discharge: 2014-03-27 | Disposition: A | Discharge status: Home / Self Care | Payer: BC Managed Care – PPO | Attending: Emergency Medicine | Admitting: Emergency Medicine

## 2014-03-27 ENCOUNTER — Encounter (HOSPITAL_COMMUNITY): Payer: Self-pay | Admitting: Emergency Medicine

## 2014-03-27 ENCOUNTER — Emergency Department (HOSPITAL_COMMUNITY): Payer: BC Managed Care – PPO

## 2014-03-27 DIAGNOSIS — S92911A Unspecified fracture of right toe(s), initial encounter for closed fracture: Secondary | ICD-10-CM

## 2014-03-27 MED ORDER — DICLOFENAC SODIUM 75 MG PO TBEC: 75.0000 mg | DELAYED_RELEASE_TABLET | Freq: Two times a day (BID) | ORAL | Status: DC

## 2014-03-27 MED ORDER — ACETAMINOPHEN 325 MG PO TABS
650.0000 mg | ORAL_TABLET | Freq: Once | ORAL | Status: AC
  Administered 2014-03-27: 650 mg via ORAL
  Filled 2014-03-27: qty 2

## 2014-03-27 MED ORDER — KETOROLAC TROMETHAMINE 10 MG PO TABS
10.0000 mg | ORAL_TABLET | Freq: Once | ORAL | Status: AC
  Administered 2014-03-27: 10 mg via ORAL
  Filled 2014-03-27: qty 1

## 2014-03-27 MED ORDER — HYDROCODONE-ACETAMINOPHEN 5-325 MG PO TABS
1.0000 | ORAL_TABLET | ORAL | Status: DC | PRN
Start: 2014-03-27 — End: 2015-08-11

## 2014-03-27 MED ORDER — HYDROCODONE-ACETAMINOPHEN 5-325 MG PO TABS
ORAL_TABLET | ORAL | Status: DC
Start: 2014-03-27 — End: 2015-08-11

## 2014-03-27 NOTE — ED Provider Notes (Signed)
CSN: [id number]     Arrival date & time 03/26/14  2351 History   First MD Initiated Contact with Patient 03/26/14 2359     Chief Complaint  Patient presents with  . Foot Pain     (Consider location/radiation/quality/duration/timing/severity/associated sxs/prior Treatment) HPI Comments: Patient states he accidentally kicked his porch while playing kickball with his child.  Patient is a 38 y.o. male presenting with lower extremity pain. The history is provided by the patient.  Foot Pain This is a new problem. The current episode started yesterday. The problem occurs constantly. The problem has been gradually worsening. Pertinent negatives include no abdominal pain, arthralgias, chest pain, coughing, neck pain, numbness or weakness. Associated symptoms comments: Toe and foot pain. The symptoms are aggravated by standing and walking. He has tried nothing for the symptoms. The treatment provided moderate relief.    Past Medical History  Diagnosis Date  . Depression   . Anxiety   . Chronic back pain   . Reflux   . Bipolar 1 disorder   . Chest pain at rest, negative MI, r/o cardiac though anxiety may be component 04/10/2012  . Hypertension   . Blind left eye     injury age 70   Past Surgical History  Procedure Laterality Date  . Appendectomy     Family History  Problem Relation Age of Onset  . Diabetes Mother   . Depression Mother   . Diabetes Father   . Depression Father   . Asthma Sister   . Depression Sister   . Cancer Maternal Aunt     lung   . Heart disease Maternal Grandmother   . Hypertension Maternal Grandmother   . Diabetes Maternal Grandmother    History  Substance Use Topics  . Smoking status: Former Smoker -- 0.50 packs/day    Types: Cigarettes  . Smokeless tobacco: Not on file  . Alcohol Use: No    Review of Systems  Constitutional: Negative for activity change.       All ROS Neg except as noted in HPI  HENT: Negative for nosebleeds.   Eyes: Negative for  photophobia and discharge.  Respiratory: Negative for cough, shortness of breath and wheezing.   Cardiovascular: Negative for chest pain and palpitations.  Gastrointestinal: Negative for abdominal pain and blood in stool.  Genitourinary: Negative for dysuria, frequency and hematuria.  Musculoskeletal: Positive for back pain. Negative for arthralgias and neck pain.  Skin: Negative.   Neurological: Negative for dizziness, seizures, speech difficulty, weakness and numbness.  Psychiatric/Behavioral: Negative for hallucinations and confusion. The patient is nervous/anxious.       Allergies  Review of patient's allergies indicates no known allergies.  Home Medications   Prior to Admission medications   Medication Sig Start Date End Date Taking? Authorizing Provider  carisoprodol (SOMA) 350 MG tablet Take 1 tablet (350 mg total) by mouth at bedtime as needed for muscle spasms. 07/11/13   Alycia Rossetti, MD  HYDROcodone-acetaminophen (NORCO) 5-325 MG per tablet Take 1 tablet by mouth every 6 (six) hours as needed. 08/10/13   Alycia Rossetti, MD  ranitidine (ZANTAC) 150 MG tablet Take 150 mg by mouth at bedtime. Acid reflux    Historical Provider, MD  simvastatin (ZOCOR) 10 MG tablet Take 1 tablet (10 mg total) by mouth at bedtime. 03/10/13   Alycia Rossetti, MD  traZODone (DESYREL) 50 MG tablet Take 1-2 tablets at bedtime as needed for sleep 06/10/13   Alycia Rossetti, MD   BP  120/78  Pulse 80  Temp(Src) 98 F (36.7 C) (Oral)  Resp 18  Ht 6' (1.829 m)  Wt 230 lb (104.327 kg)  BMI 31.19 kg/m2  SpO2 98% Physical Exam  Nursing note and vitals reviewed. Constitutional: He is oriented to person, place, and time. He appears well-developed and well-nourished.  Non-toxic appearance.  HENT:  Head: Normocephalic.  Right Ear: Tympanic membrane and external ear normal.  Left Ear: Tympanic membrane and external ear normal.  Eyes: EOM and lids are normal. Pupils are equal, round, and reactive  to light.  Neck: Normal range of motion. Neck supple. Carotid bruit is not present.  Cardiovascular: Normal rate, regular rhythm, normal heart sounds, intact distal pulses and normal pulses.   Pulmonary/Chest: Breath sounds normal. No respiratory distress.  Abdominal: Soft. Bowel sounds are normal. There is no tenderness. There is no guarding.  Musculoskeletal: Normal range of motion.  There is good range of motion of the right hip and knee. There is no deformity of the tibia-fibula area. There is bruising about the right first toe at the dorsum and the plantar surface. There is bruising of the base of the second toe dorsally. There is pain and swelling to the metatarsal area over the right fifth toe. The Achilles tendon is intact. The dorsalis pedis and posterior tibial pulses are 2+ bilaterally.  Lymphadenopathy:       Head (right side): No submandibular adenopathy present.       Head (left side): No submandibular adenopathy present.    He has no cervical adenopathy.  Neurological: He is alert and oriented to person, place, and time. He has normal strength. No cranial nerve deficit or sensory deficit.  Skin: Skin is warm and dry.  Psychiatric: He has a normal mood and affect. His speech is normal.    ED Course  Procedures   FRACTURE CARE - FRACTURE OF THE RIGHT FIRST TOE.  Patient identified by arm band. Permission for the procedure is given by the patient. The fracture was explained to the patient using the x-rays that were taken. Patient was educated on the fracture, as well as the technique that would be used for splinting. The first and second toe were buddy taped. The patient was then placed in a postoperative shoe. Prescriptions for Norco and diclofenac were given to the patient for assistance with pain, along with instructions on the importance of ice and elevation. The patient will followup with orthopedics later during the week for office evaluation and continued management. Patient  tolerated the procedure without problem. Labs Review Labs Reviewed - No data to display  Imaging Review No results found.   EKG Interpretation None      MDM  X-ray is consistent with fracture involving the right first toe. Patient is neurovascularly intact. Patient was treated with buddy tape and postoperative shoe. Prescriptions for Norco and diclofenac given to the patient, along with instructions on ice and elevation. Patient will followup with orthopedics in the office.   Patient refused the postoperative shoe, states that his foot feels better in his regular tennis shoes.    Final diagnoses:  None    **I have reviewed nursing notes, vital signs, and all appropriate lab and imaging results for this patient.Lenox Ahr, PA-C 03/27/14 2233

## 2014-03-27 NOTE — Discharge Instructions (Signed)
You have a broken bone in your foot, your first toe. It is important that you see Dr. Aline Brochure for additional evaluation and management of this fracture. Please keep the foot elevated above your waist. Please use the buddy tape splint and post op shoe until seen by orthopedics.

## 2014-03-27 NOTE — ED Notes (Signed)
Pt has good pedal pulse to right foot and warm to touch.

## 2014-03-27 NOTE — ED Notes (Signed)
Pt refused post-op shoe, states it felt better to put on his regular tennis shoes. First toe and second toe buddy taped.

## 2014-03-30 NOTE — ED Provider Notes (Signed)
Medical screening examination/treatment/procedure(s) were performed by non-physician practitioner and as supervising physician I was immediately available for consultation/collaboration.   EKG Interpretation None        Julianne Rice, MD 03/30/14 [phone]

## 2014-03-31 MED FILL — Hydrocodone-Acetaminophen Tab 5-325 MG: ORAL | Qty: 6 | Status: AC

## 2014-05-05 ENCOUNTER — Other Ambulatory Visit: Payer: Self-pay

## 2014-12-08 ENCOUNTER — Encounter (HOSPITAL_COMMUNITY): Payer: Self-pay | Admitting: Emergency Medicine

## 2014-12-08 ENCOUNTER — Emergency Department (HOSPITAL_COMMUNITY)
Admission: EM | Admit: 2014-12-08 | Discharge: 2014-12-08 | Disposition: A | Discharge status: Home / Self Care | Payer: BLUE CROSS/BLUE SHIELD | Attending: Emergency Medicine | Admitting: Emergency Medicine

## 2014-12-08 DIAGNOSIS — Z79899 Other long term (current) drug therapy: Secondary | ICD-10-CM | POA: Diagnosis not present

## 2014-12-08 DIAGNOSIS — F419 Anxiety disorder, unspecified: Secondary | ICD-10-CM | POA: Diagnosis not present

## 2014-12-08 DIAGNOSIS — G8929 Other chronic pain: Secondary | ICD-10-CM | POA: Diagnosis not present

## 2014-12-08 DIAGNOSIS — F329 Major depressive disorder, single episode, unspecified: Secondary | ICD-10-CM | POA: Insufficient documentation

## 2014-12-08 DIAGNOSIS — I10 Essential (primary) hypertension: Secondary | ICD-10-CM | POA: Diagnosis not present

## 2014-12-08 DIAGNOSIS — Z87891 Personal history of nicotine dependence: Secondary | ICD-10-CM | POA: Insufficient documentation

## 2014-12-08 DIAGNOSIS — Z791 Long term (current) use of non-steroidal anti-inflammatories (NSAID): Secondary | ICD-10-CM | POA: Insufficient documentation

## 2014-12-08 DIAGNOSIS — H5442 Blindness, left eye, normal vision right eye: Secondary | ICD-10-CM | POA: Insufficient documentation

## 2014-12-08 DIAGNOSIS — L03221 Cellulitis of neck: Secondary | ICD-10-CM | POA: Diagnosis not present

## 2014-12-08 DIAGNOSIS — K219 Gastro-esophageal reflux disease without esophagitis: Secondary | ICD-10-CM | POA: Insufficient documentation

## 2014-12-08 DIAGNOSIS — R221 Localized swelling, mass and lump, neck: Secondary | ICD-10-CM | POA: Diagnosis present

## 2014-12-08 LAB — CBC WITH DIFFERENTIAL/PLATELET
BASOS ABS: 0 10*3/uL (ref 0.0–0.1)
BASOS PCT: 0 % (ref 0–1)
EOS ABS: 0.3 10*3/uL (ref 0.0–0.7)
EOS PCT: 3 % (ref 0–5)
HCT: 42 % (ref 39.0–52.0)
Hemoglobin: 14.7 g/dL (ref 13.0–17.0)
Lymphocytes Relative: 25 % (ref 12–46)
Lymphs Abs: 2.6 10*3/uL (ref 0.7–4.0)
MCH: 31.6 pg (ref 26.0–34.0)
MCHC: 35 g/dL (ref 30.0–36.0)
MCV: 90.3 fL (ref 78.0–100.0)
MONO ABS: 0.5 10*3/uL (ref 0.1–1.0)
Monocytes Relative: 5 % (ref 3–12)
Neutro Abs: 6.7 10*3/uL (ref 1.7–7.7)
Neutrophils Relative %: 67 % (ref 43–77)
Platelets: 200 10*3/uL (ref 150–400)
RBC: 4.65 MIL/uL (ref 4.22–5.81)
RDW: 12.4 % (ref 11.5–15.5)
WBC: 10.1 10*3/uL (ref 4.0–10.5)

## 2014-12-08 LAB — BASIC METABOLIC PANEL
Anion gap: 8 (ref 5–15)
BUN: 15 mg/dL (ref 6–20)
CALCIUM: 9.1 mg/dL (ref 8.9–10.3)
CO2: 24 mmol/L (ref 22–32)
CREATININE: 0.95 mg/dL (ref 0.61–1.24)
Chloride: 106 mmol/L (ref 101–111)
GFR calc Af Amer: >60 mL/min (ref >60)
GFR calc non Af Amer: >60 mL/min (ref >60)
GLUCOSE: 121 mg/dL — AB (ref 65–99)
Potassium: 4 mmol/L (ref 3.5–5.1)
SODIUM: 138 mmol/L (ref 135–145)

## 2014-12-08 MED ORDER — CEFAZOLIN SODIUM 1-5 GM-% IV SOLN
1.0000 g | Freq: Once | INTRAVENOUS | Status: AC
  Administered 2014-12-08: 1 g via INTRAVENOUS
  Filled 2014-12-08: qty 50

## 2014-12-08 MED ORDER — SULFAMETHOXAZOLE-TRIMETHOPRIM 800-160 MG PO TABS: 1.0000 | ORAL_TABLET | Freq: Two times a day (BID) | ORAL | Status: AC

## 2014-12-08 MED ORDER — HYDROCODONE-ACETAMINOPHEN 5-325 MG PO TABS
1.0000 | ORAL_TABLET | ORAL | Status: AC
  Administered 2014-12-08: 1 via ORAL
  Filled 2014-12-08: qty 1

## 2014-12-08 MED ORDER — CEPHALEXIN 500 MG PO CAPS: 500.0000 mg | ORAL_CAPSULE | Freq: Four times a day (QID) | ORAL | Status: DC

## 2014-12-08 NOTE — ED Provider Notes (Signed)
CSN: [id number]     Arrival date & time 12/08/14  2058 History  This chart was scribed for Dorie Rank, MD by Irene Pap, ED Scribe. This patient was seen in room APA04/[facility]04 and patient care was started at 9:39 PM.   Chief Complaint  Patient presents with  . Facial Swelling    The history is provided by the patient. No language interpreter was used.    HPI Comments: Alec Carter is a 39 y.o. male who presents to the Emergency Department complaining of neck swelling, rash and pain onset 2 days ago. He states that there is a rash to the frontal area of the neck with redness that radiates to the top of his chest. He reports some SOB, itching to the area, and some trouble swallowing. He states that there is some purulence from where he has scratched, but states that he does not think it is pus. He denies fever, chills, nausea, or vomiting. He denies a history of DM or any other significant medical history.    Past Medical History  Diagnosis Date  . Depression   . Anxiety   . Chronic back pain   . Reflux   . Bipolar 1 disorder   . Chest pain at rest, negative MI, r/o cardiac though anxiety may be component 04/10/2012  . Hypertension   . Blind left eye     injury age 66   Past Surgical History  Procedure Laterality Date  . Appendectomy     Family History  Problem Relation Age of Onset  . Diabetes Mother   . Depression Mother   . Diabetes Father   . Depression Father   . Asthma Sister   . Depression Sister   . Cancer Maternal Aunt     lung   . Heart disease Maternal Grandmother   . Hypertension Maternal Grandmother   . Diabetes Maternal Grandmother    History  Substance Use Topics  . Smoking status: Former Smoker -- 0.50 packs/day    Types: Cigarettes  . Smokeless tobacco: Not on file  . Alcohol Use: No    Review of Systems  All other systems reviewed and are negative.  Allergies  Review of patient's allergies indicates no known allergies.  Home Medications    Prior to Admission medications   Medication Sig Start Date End Date Taking? Authorizing Provider  carisoprodol (SOMA) 350 MG tablet Take 1 tablet (350 mg total) by mouth at bedtime as needed for muscle spasms. 07/11/13   Alycia Rossetti, MD  diclofenac (VOLTAREN) 75 MG EC tablet Take 1 tablet (75 mg total) by mouth 2 (two) times daily. 03/27/14   Lily Kocher, PA-C  HYDROcodone-acetaminophen (NORCO) 5-325 MG per tablet Take 1 tablet by mouth every 6 (six) hours as needed. 08/10/13   Alycia Rossetti, MD  HYDROcodone-acetaminophen (NORCO/VICODIN) 5-325 MG per tablet 1 or 2 po q4h prn pain 03/27/14   Lily Kocher, PA-C  HYDROcodone-acetaminophen (NORCO/VICODIN) 5-325 MG per tablet Take 1 tablet by mouth every 4 (four) hours as needed. 03/27/14   Lily Kocher, PA-C  ranitidine (ZANTAC) 150 MG tablet Take 150 mg by mouth at bedtime. Acid reflux    Historical Provider, MD  simvastatin (ZOCOR) 10 MG tablet Take 1 tablet (10 mg total) by mouth at bedtime. 03/10/13   Alycia Rossetti, MD  traZODone (DESYREL) 50 MG tablet Take 1-2 tablets at bedtime as needed for sleep 06/10/13   Alycia Rossetti, MD   BP 127/81 mmHg  Pulse  81  Temp(Src) 98.5 F (36.9 C) (Oral)  Resp 18  Ht 6\' 1"  (1.854 m)  Wt 200 lb (90.719 kg)  BMI 26.39 kg/m2  SpO2 97% Physical Exam  Constitutional: He appears well-developed and well-nourished. No distress.  HENT:  Head: Normocephalic and atraumatic.  Right Ear: External ear normal.  Left Ear: External ear normal.  Eyes: Conjunctivae are normal. Right eye exhibits no discharge. Left eye exhibits no discharge. No scleral icterus.  Neck: Neck supple. No tracheal deviation present. No thyromegaly present.  Area of small scab with surrounding erythema and induration , no fluctuance  Cardiovascular: Normal rate, regular rhythm and normal heart sounds.   Pulmonary/Chest: Effort normal and breath sounds normal. No stridor. No respiratory distress. He has no wheezes.   Musculoskeletal: He exhibits no edema.  Lymphadenopathy:    He has cervical adenopathy.  Neurological: He is alert. Cranial nerve deficit: no gross deficits.  Skin: Skin is warm and dry. No rash noted.  Psychiatric: He has a normal mood and affect.  Nursing note and vitals reviewed.   ED Course  Procedures (including critical care time) DIAGNOSTIC STUDIES: Oxygen Saturation is 97% on room air, normal by my interpretation.    COORDINATION OF CARE: 9:43 PM-Discussed treatment plan which includes IV fluids, anti-biotics and the use of warm compresses at home with pt at bedside and pt agreed to plan.   Labs Review Labs Reviewed  BASIC METABOLIC PANEL - Abnormal; Notable for the following:    Glucose, Bld 121 (*)    All other components within normal limits  CBC WITH DIFFERENTIAL/PLATELET      MDM   Final diagnoses:  Cellulitis of neck   Exam is consistent with a small furuncle that resulted in a cellulitis. He is not having any evidence of airway compromise. He has no stridor no difficulty with his secretions. Patient was given a dose of IV antibiotics. I'll discharge him home with oral antibiotics including coverage for MRSA. Apply warm compresses. Follow-up with primary doctor in 1-2 days to be rechecked warning signs and precautions discussed.   Dorie Rank, MD 12/08/14 [phone]

## 2014-12-08 NOTE — ED Notes (Signed)
Patient states he shaved 3 days ago and 1 1/2 days ago, his neck started swelling and has a rash appear.  Patient states at times, he has difficulty swallowing, states rash is itchy.

## 2014-12-08 NOTE — Discharge Instructions (Signed)

## 2015-08-11 ENCOUNTER — Encounter (HOSPITAL_COMMUNITY): Payer: Self-pay

## 2015-08-11 ENCOUNTER — Inpatient Hospital Stay (HOSPITAL_COMMUNITY)
Admission: EM | Admit: 2015-08-11 | Discharge: 2015-08-13 | DRG: 603 | Disposition: A | Discharge status: Home / Self Care | Payer: BLUE CROSS/BLUE SHIELD | Attending: Family Medicine | Admitting: Family Medicine

## 2015-08-11 ENCOUNTER — Emergency Department (HOSPITAL_COMMUNITY): Payer: BLUE CROSS/BLUE SHIELD

## 2015-08-11 DIAGNOSIS — G8929 Other chronic pain: Secondary | ICD-10-CM | POA: Diagnosis present

## 2015-08-11 DIAGNOSIS — Z833 Family history of diabetes mellitus: Secondary | ICD-10-CM | POA: Diagnosis not present

## 2015-08-11 DIAGNOSIS — F319 Bipolar disorder, unspecified: Secondary | ICD-10-CM | POA: Diagnosis present

## 2015-08-11 DIAGNOSIS — D72829 Elevated white blood cell count, unspecified: Secondary | ICD-10-CM | POA: Diagnosis present

## 2015-08-11 DIAGNOSIS — K219 Gastro-esophageal reflux disease without esophagitis: Secondary | ICD-10-CM | POA: Diagnosis present

## 2015-08-11 DIAGNOSIS — L0390 Cellulitis, unspecified: Secondary | ICD-10-CM | POA: Diagnosis present

## 2015-08-11 DIAGNOSIS — Z79891 Long term (current) use of opiate analgesic: Secondary | ICD-10-CM

## 2015-08-11 DIAGNOSIS — Z8249 Family history of ischemic heart disease and other diseases of the circulatory system: Secondary | ICD-10-CM

## 2015-08-11 DIAGNOSIS — Z818 Family history of other mental and behavioral disorders: Secondary | ICD-10-CM

## 2015-08-11 DIAGNOSIS — Z87891 Personal history of nicotine dependence: Secondary | ICD-10-CM

## 2015-08-11 DIAGNOSIS — I10 Essential (primary) hypertension: Secondary | ICD-10-CM | POA: Diagnosis present

## 2015-08-11 DIAGNOSIS — L03314 Cellulitis of groin: Principal | ICD-10-CM | POA: Diagnosis present

## 2015-08-11 DIAGNOSIS — Z872 Personal history of diseases of the skin and subcutaneous tissue: Secondary | ICD-10-CM

## 2015-08-11 DIAGNOSIS — H5442 Blindness, left eye, normal vision right eye: Secondary | ICD-10-CM | POA: Diagnosis present

## 2015-08-11 DIAGNOSIS — M545 Low back pain: Secondary | ICD-10-CM | POA: Diagnosis present

## 2015-08-11 DIAGNOSIS — R1031 Right lower quadrant pain: Secondary | ICD-10-CM | POA: Diagnosis present

## 2015-08-11 DIAGNOSIS — Z825 Family history of asthma and other chronic lower respiratory diseases: Secondary | ICD-10-CM

## 2015-08-11 HISTORY — DX: Personal history of diseases of the skin and subcutaneous tissue: Z87.2

## 2015-08-11 LAB — BASIC METABOLIC PANEL
ANION GAP: 11 (ref 5–15)
BUN: 11 mg/dL (ref 6–20)
CHLORIDE: 100 mmol/L — AB (ref 101–111)
CO2: 25 mmol/L (ref 22–32)
CREATININE: 1 mg/dL (ref 0.61–1.24)
Calcium: 9.5 mg/dL (ref 8.9–10.3)
GFR calc Af Amer: >60 mL/min (ref >60)
GFR calc non Af Amer: >60 mL/min (ref >60)
Glucose, Bld: 123 mg/dL — ABNORMAL HIGH (ref 65–99)
Potassium: 3.8 mmol/L (ref 3.5–5.1)
Sodium: 136 mmol/L (ref 135–145)

## 2015-08-11 LAB — CBC WITH DIFFERENTIAL/PLATELET
Basophils Absolute: 0 10*3/uL (ref 0.0–0.1)
Basophils Relative: 0 %
EOS ABS: 0.1 10*3/uL (ref 0.0–0.7)
Eosinophils Relative: 0 %
HCT: 42.6 % (ref 39.0–52.0)
HEMOGLOBIN: 15.2 g/dL (ref 13.0–17.0)
Lymphocytes Relative: 10 %
Lymphs Abs: 1.8 10*3/uL (ref 0.7–4.0)
MCH: 31.8 pg (ref 26.0–34.0)
MCHC: 35.7 g/dL (ref 30.0–36.0)
MCV: 89.1 fL (ref 78.0–100.0)
MONO ABS: 1 10*3/uL (ref 0.1–1.0)
MONOS PCT: 6 %
NEUTROS PCT: 84 %
Neutro Abs: 14.3 10*3/uL — ABNORMAL HIGH (ref 1.7–7.7)
PLATELETS: 169 10*3/uL (ref 150–400)
RBC: 4.78 MIL/uL (ref 4.22–5.81)
RDW: 12.2 % (ref 11.5–15.5)
WBC: 17.2 10*3/uL — ABNORMAL HIGH (ref 4.0–10.5)

## 2015-08-11 LAB — LACTIC ACID, PLASMA
Lactic Acid, Venous: 0.9 mmol/L (ref 0.5–2.0)
Lactic Acid, Venous: 0.8 mmol/L (ref 0.5–2.0)

## 2015-08-11 MED ORDER — SODIUM CHLORIDE 0.9 % IV SOLN
INTRAVENOUS | Status: DC
  Administered 2015-08-11: 1000 mL via INTRAVENOUS
  Administered 2015-08-11: 11:00:00 via INTRAVENOUS

## 2015-08-11 MED ORDER — SODIUM CHLORIDE 0.9 % IJ SOLN
3.0000 mL | Freq: Two times a day (BID) | INTRAMUSCULAR | Status: DC
  Administered 2015-08-12: 3 mL via INTRAVENOUS

## 2015-08-11 MED ORDER — SODIUM CHLORIDE 0.9 % IJ SOLN: 3.0000 mL | INTRAMUSCULAR | Status: DC | PRN

## 2015-08-11 MED ORDER — HYDROMORPHONE HCL 1 MG/ML IJ SOLN
1.0000 mg | INTRAMUSCULAR | Status: DC | PRN
  Administered 2015-08-11: 1 mg via INTRAVENOUS
  Filled 2015-08-11: qty 1

## 2015-08-11 MED ORDER — TETANUS-DIPHTH-ACELL PERTUSSIS 5-2.5-18.5 LF-MCG/0.5 IM SUSP
0.5000 mL | Freq: Once | INTRAMUSCULAR | Status: DC
  Filled 2015-08-11: qty 0.5

## 2015-08-11 MED ORDER — CLINDAMYCIN PHOSPHATE 600 MG/50ML IV SOLN
600.0000 mg | Freq: Three times a day (TID) | INTRAVENOUS | Status: DC
  Administered 2015-08-11 – 2015-08-12 (×3): 600 mg via INTRAVENOUS
  Filled 2015-08-11 (×7): qty 50

## 2015-08-11 MED ORDER — HYDROCODONE-ACETAMINOPHEN 10-325 MG PO TABS
1.0000 | ORAL_TABLET | Freq: Four times a day (QID) | ORAL | Status: DC | PRN
  Administered 2015-08-11: 1 via ORAL
  Administered 2015-08-12 – 2015-08-13 (×3): 2 via ORAL
  Filled 2015-08-11 (×4): qty 2
  Filled 2015-08-11: qty 1

## 2015-08-11 MED ORDER — TETANUS-DIPHTHERIA TOXOIDS TD 5-2 LFU IM INJ: 0.5000 mL | INJECTION | Freq: Once | INTRAMUSCULAR | Status: DC

## 2015-08-11 MED ORDER — ONDANSETRON HCL 4 MG/2ML IJ SOLN: 4.0000 mg | Freq: Three times a day (TID) | INTRAMUSCULAR | Status: AC | PRN

## 2015-08-11 MED ORDER — FAMOTIDINE 20 MG PO TABS
20.0000 mg | ORAL_TABLET | Freq: Two times a day (BID) | ORAL | Status: DC
  Administered 2015-08-11 – 2015-08-13 (×5): 20 mg via ORAL
  Filled 2015-08-11 (×5): qty 1

## 2015-08-11 MED ORDER — MORPHINE SULFATE (PF) 2 MG/ML IV SOLN: 1.0000 mg | INTRAVENOUS | Status: DC | PRN

## 2015-08-11 MED ORDER — ALPRAZOLAM 0.5 MG PO TABS: 0.5000 mg | ORAL_TABLET | Freq: Two times a day (BID) | ORAL | Status: DC | PRN

## 2015-08-11 MED ORDER — ONDANSETRON HCL 4 MG/2ML IJ SOLN
4.0000 mg | INTRAMUSCULAR | Status: DC | PRN
  Administered 2015-08-11: 4 mg via INTRAVENOUS
  Filled 2015-08-11: qty 2

## 2015-08-11 MED ORDER — DOCUSATE SODIUM 100 MG PO CAPS
100.0000 mg | ORAL_CAPSULE | Freq: Two times a day (BID) | ORAL | Status: DC
  Administered 2015-08-11 – 2015-08-13 (×3): 100 mg via ORAL
  Filled 2015-08-11 (×4): qty 1

## 2015-08-11 MED ORDER — MORPHINE SULFATE (PF) 4 MG/ML IV SOLN
4.0000 mg | INTRAVENOUS | Status: AC | PRN
  Administered 2015-08-11 (×2): 4 mg via INTRAVENOUS
  Filled 2015-08-11 (×2): qty 1

## 2015-08-11 MED ORDER — IOHEXOL 300 MG/ML  SOLN
100.0000 mL | Freq: Once | INTRAMUSCULAR | Status: AC | PRN
  Administered 2015-08-11: 100 mL via INTRAVENOUS

## 2015-08-11 MED ORDER — HYDROMORPHONE HCL 1 MG/ML IJ SOLN
1.0000 mg | INTRAMUSCULAR | Status: AC | PRN
  Administered 2015-08-11: 1 mg via INTRAVENOUS
  Filled 2015-08-11 (×2): qty 1

## 2015-08-11 MED ORDER — PROMETHAZINE HCL 12.5 MG PO TABS: 12.5000 mg | ORAL_TABLET | Freq: Four times a day (QID) | ORAL | Status: DC | PRN

## 2015-08-11 MED ORDER — TETANUS-DIPHTH-ACELL PERTUSSIS 5-2.5-18.5 LF-MCG/0.5 IM SUSP
0.5000 mL | Freq: Once | INTRAMUSCULAR | Status: AC
  Administered 2015-08-11: 0.5 mL via INTRAMUSCULAR
  Filled 2015-08-11: qty 0.5

## 2015-08-11 MED ORDER — CLINDAMYCIN PHOSPHATE 900 MG/50ML IV SOLN
900.0000 mg | Freq: Once | INTRAVENOUS | Status: AC
  Administered 2015-08-11: 900 mg via INTRAVENOUS
  Filled 2015-08-11: qty 50

## 2015-08-11 MED ORDER — ENOXAPARIN SODIUM 40 MG/0.4ML ~~LOC~~ SOLN
40.0000 mg | SUBCUTANEOUS | Status: DC
  Administered 2015-08-11: 40 mg via SUBCUTANEOUS
  Filled 2015-08-11: qty 0.4

## 2015-08-11 MED ORDER — SODIUM CHLORIDE 0.9 % IV SOLN: 250.0000 mL | INTRAVENOUS | Status: DC | PRN

## 2015-08-11 MED ORDER — KETOROLAC TROMETHAMINE 30 MG/ML IJ SOLN
30.0000 mg | Freq: Four times a day (QID) | INTRAMUSCULAR | Status: DC
  Administered 2015-08-11 – 2015-08-13 (×8): 30 mg via INTRAVENOUS
  Filled 2015-08-11 (×8): qty 1

## 2015-08-11 NOTE — H&P (Signed)
History and Physical  Alec Carter [id number] DOB: 22-Mar-1976 DOA: 08/11/2015  Referring physician: ED physician PCP: Terald Sleeper, PA-C   Chief Complaint: right groin pain  HPI:  40 year old male with right groin pain that began about 5 days prior to hospital presentation.  Patient initially noticed pain and redness while at work 5 days prior to admission.  Upon arriving home he showered and used moist heat to help with the pain.  He did not use any antibiotic creams, powders, ice nor did he elevate the scrotum.  The pain and redness increased gradually as the work week went on and he ultimately called his physician 2-3 days after the pain and redness began for treatment.  He was called in Bactrim and subsequently took two doses thus far of the Bactrim prior to the hospitalization.  Yesterday patient used a new razor blade to lance the area and notes that blood and a dark brown liquid came out.  He was unable to extract much mostly due to pain but mentions the bandage he put on the area did display thick white chunks when it was removed yesterday evening. This morning patient awoke with significant pain and redness to the area.  He notes he was sweating profusely and felt nauseous and feverish.  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  The pain is worsened upon movement after long periods of inactivity as well as any activity.  He has a history of cellulitis with possible abscess of the neck 2/2 ingrown hair about 3-4 months ago.  Pain is 7/10 and slightly dulled after IV pain medication.    In the emergency department patient underwent CT pelvis (results below) as well as baseline labs Pertinent labs: below Imaging: independently reviewed.   Review of Systems:  As mentioned in HPI Negative for visual changes, sore throat, new muscle aches, chest pain, SOB, dysuria, bleeding,  v/abdominal pain.  Past Medical History  Diagnosis Date  . Depression   . Anxiety   . Chronic back pain   . Reflux   . Bipolar 1 disorder ([place])   . Chest pain at rest, negative MI, r/o cardiac though anxiety may be component 04/10/2012  . Hypertension   . Blind left eye     injury age 47    Past Surgical History  Procedure Laterality Date  . Appendectomy      Social History:  reports that he has quit smoking. His smoking use included Cigarettes. He smoked 0.50 packs per day. He does not have any smokeless tobacco history on file. He reports that he does not drink alcohol or use illicit drugs. lives with their family and works in a [employer]  No Known Allergies  Family History  Problem Relation Age of Onset  . Diabetes Mother   . Depression Mother   . Diabetes Father   . Depression Father   . Asthma Sister   . Depression Sister   . Cancer Maternal Aunt     lung   . Heart disease Maternal Grandmother   . Hypertension Maternal Grandmother   . Diabetes Maternal Grandmother      Prior to Admission medications   Medication Sig Start Date End Date Taking? Authorizing Provider  ALPRAZolam Duanne Moron) 0.5 MG tablet Take 0.5 mg by mouth 2 (two) times daily as needed for anxiety.  Yes Historical Provider, MD  HYDROcodone-acetaminophen (NORCO) 10-325 MG per tablet Take 1-2 tablets by mouth every 6 (six) hours as needed for moderate pain.  12/05/14  Yes Historical Provider, MD  ranitidine (ZANTAC) 150 MG tablet Take 150 mg by mouth every 4 (four) hours as needed for heartburn. Acid reflux   Yes Historical Provider, MD  sulfamethoxazole-trimethoprim (BACTRIM DS,SEPTRA DS) 800-160 MG tablet Take 1 tablet by mouth 2 (two) times daily.  08/10/15  Yes Historical Provider, MD   Physical Exam: Filed Vitals:   08/11/15 0328 08/11/15 0616 08/11/15 0830 08/11/15 1018  BP: 123/97 111/80 121/86   Pulse: 111 78 80   Temp: 98 F (36.7 C)     TempSrc: Oral     Resp: 16 18 16      Height:    6' (1.829 m)  Weight:    99.791 kg (220 lb)  SpO2: 100% 95% 98%     Physical Exam General:  Appears calm and comfortable Eyes: PERRL, normal lids, irises & conjunctiva ENT: grossly normal hearing, lips & tongue Neck: no LAD, masses or thyromegaly Cardiovascular: RRR, no m/r/g. No LE edema.  Respiratory: CTA bilaterally, no w/r/r. Normal respiratory effort. Abdomen: soft, only tender in the right medial lower quadrant, normal bowel sounds appreciated in all quadrants Skin: induration of the medial right lower quadrant with no crepitus or fluctuance appreciated Musculoskeletal: grossly normal tone BUE/BLE Psychiatric: grossly normal mood and affect, speech fluent and appropriate Neurologic: grossly non-focal.  Wt Readings from Last 3 Encounters:  08/11/15 99.791 kg (220 lb)  12/08/14 90.719 kg (200 lb)  03/27/14 104.327 kg (230 lb)    Labs on Admission:  Basic Metabolic Panel:  Recent Labs Lab 08/11/15 0415  NA 136  K 3.8  CL 100*  CO2 25  GLUCOSE 123*  BUN 11  CREATININE 1.00  CALCIUM 9.5    Liver Function Tests: No results for input(s): AST, ALT, ALKPHOS, BILITOT, PROT, ALBUMIN in the last 168 hours. No results for input(s): LIPASE, AMYLASE in the last 168 hours. No results for input(s): AMMONIA in the last 168 hours.  CBC:  Recent Labs Lab 08/11/15 0415  WBC 17.2*  NEUTROABS 14.3*  HGB 15.2  HCT 42.6  MCV 89.1  PLT 169    Cardiac Enzymes: No results for input(s): CKTOTAL, CKMB, CKMBINDEX, TROPONINI in the last 168 hours.  Troponin (Point of Care Test) No results for input(s): TROPIPOC in the last 72 hours.  BNP (last 3 results) No results for input(s): PROBNP in the last 8760 hours.  CBG: No results for input(s): GLUCAP in the last 168 hours.   Radiological Exams on Admission: Ct Pelvis W Contrast  08/11/2015  CLINICAL DATA:  Groin swelling for several days, possible abscess EXAM: CT PELVIS WITH CONTRAST TECHNIQUE: Multidetector  CT imaging of the pelvis was performed using the standard protocol following the bolus administration of intravenous contrast. CONTRAST:  168mL OMNIPAQUE IOHEXOL 300 MG/ML  SOLN COMPARISON:  None. FINDINGS: Bladder is partially distended. No pelvic mass lesion or sidewall abnormality is noted. The appendix has been surgically removed. Mild aortoiliac calcifications are noted without aneurysmal dilatation. There is an area of induration in the right groin extending towards the scrotum without definitive focal fluid collection to suggest subcutaneous abscess. These changes are more worse consistent with cellulitis. Few reactive lymph nodes are noted in the right inguinal region. No other lymphadenopathy is seen. Bony structures are within normal limits. IMPRESSION: Induration consistent with cellulitis in the right groin extending inferiorly  towards the scrotum. No definitive subcutaneous abscess is seen. Some reactive lymph nodes are noted. Electronically Signed   By: Inez Catalina M.D.   On: 08/11/2015 07:50      Active Problems:   Cellulitis of right groin   Cellulitis   Assessment/Plan 1. Right groin cellulitis: continue Clindamycin 600mg  IV q6h, can consider addition of Doxycycline if improvement not seen on clindamycin and appears to become purulent/ abscess formed.  Gen surgery to see patient tomorrow.  Toradol and morphine for pain management. 2. Chronic LBP: continue patients home medication of Hydrocodone 10/325 3. GI: Regular diet, Pepcid, Phenergan and Zofran ordered 4. VTE: lovenox     Code Status: full  DVT prophylaxis:lovenox Family Communication: no family at bedside Disposition Plan/Anticipated LOS: 2-3 days pending resolution of cellulitis  Time spent: 37 minutes  Coralie Common, MD  [facility]  08/11/2015, 10:51 AM

## 2015-08-11 NOTE — ED Notes (Signed)
Pt has a large area of swelling and redness to groin area for a couple of days, seen at pmd office yesterday and given Bactrim.  Pt states he tried to open the area himself with a blade and did get it to drain some.  Pt states pain is worse tonight

## 2015-08-11 NOTE — ED Provider Notes (Signed)
CSN: [id number]     Arrival date & time 08/11/15  [id number] History   First MD Initiated Contact with Patient 08/11/15 0402     Chief Complaint  Patient presents with  . Abscess  . Cellulitis      HPI Pt was seen at 0400. Per pt, c/o gradual onset and worsening of persistent right groin "rash" that began 3 days ago. Pt states the rash started as a small reddened area on his right medial groin. Pt states his PMD called in a prescription of Bactrim and he began to take it yesterday. Pt states he also "opened the area with a blade" yesterday and "got it to drain." Pt states he woke up this morning with increasing pain to his right groin and "sweating." Pt did not take his temperature at home. Denies any other areas of rash, no abd pain, no testicular pain/swelling.    Past Medical History  Diagnosis Date  . Depression   . Anxiety   . Chronic back pain   . Reflux   . Bipolar 1 disorder ([place])   . Chest pain at rest, negative MI, r/o cardiac though anxiety may be component 04/10/2012  . Hypertension   . Blind left eye     injury age 67   Past Surgical History  Procedure Laterality Date  . Appendectomy     Family History  Problem Relation Age of Onset  . Diabetes Mother   . Depression Mother   . Diabetes Father   . Depression Father   . Asthma Sister   . Depression Sister   . Cancer Maternal Aunt     lung   . Heart disease Maternal Grandmother   . Hypertension Maternal Grandmother   . Diabetes Maternal Grandmother    Social History  Substance Use Topics  . Smoking status: Former Smoker -- 0.50 packs/day    Types: Cigarettes  . Smokeless tobacco: None  . Alcohol Use: No    Review of Systems ROS: Statement: All systems negative except as marked or noted in the HPI; Constitutional: Negative for fever and chills. ; ; Eyes: Negative for eye pain, redness and discharge. ; ; ENMT: Negative for ear pain, hoarseness, nasal congestion, sinus pressure and sore throat. ; ; Cardiovascular:  Negative for chest pain, palpitations, diaphoresis, dyspnea and peripheral edema. ; ; Respiratory: Negative for cough, wheezing and stridor. ; ; Gastrointestinal: Negative for nausea, vomiting, diarrhea, abdominal pain, blood in stool, hematemesis, jaundice and rectal bleeding. . ; ; Genitourinary: Negative for dysuria, flank pain and hematuria. ; ; Musculoskeletal: Negative for back pain and neck pain. Negative for swelling and trauma.; ; Skin: +rash. Negative for pruritus, abrasions, blisters, bruising and skin lesion.; ; Neuro: Negative for headache, lightheadedness and neck stiffness. Negative for weakness, altered level of consciousness , altered mental status, extremity weakness, paresthesias, involuntary movement, seizure and syncope.      Allergies  Review of patient's allergies indicates no known allergies.  Home Medications   Prior to Admission medications   Medication Sig Start Date End Date Taking? Authorizing Provider  ALPRAZolam Duanne Moron) 0.5 MG tablet Take 0.5 mg by mouth 2 (two) times daily as needed for anxiety.    Historical Provider, MD  carisoprodol (SOMA) 350 MG tablet Take 1 tablet (350 mg total) by mouth at bedtime as needed for muscle spasms. Patient not taking: Reported on 12/08/2014 07/11/13   Alycia Rossetti, MD  cephALEXin (KEFLEX) 500 MG capsule Take 1 capsule (500 mg total) by mouth 4 (  four) times daily. 12/08/14   Dorie Rank, MD  diclofenac (VOLTAREN) 75 MG EC tablet Take 1 tablet (75 mg total) by mouth 2 (two) times daily. Patient not taking: Reported on 12/08/2014 03/27/14   Lily Kocher, PA-C  HYDROcodone-acetaminophen [facility]) 10-325 MG per tablet Take 1-2 tablets by mouth every 6 (six) hours as needed for moderate pain.  12/05/14   Historical Provider, MD  HYDROcodone-acetaminophen (NORCO) 5-325 MG per tablet Take 1 tablet by mouth every 6 (six) hours as needed. Patient not taking: Reported on 12/08/2014 08/10/13   Alycia Rossetti, MD  HYDROcodone-acetaminophen  (NORCO/VICODIN) 5-325 MG per tablet 1 or 2 po q4h prn pain Patient not taking: Reported on 12/08/2014 03/27/14   Lily Kocher, PA-C  HYDROcodone-acetaminophen (NORCO/VICODIN) 5-325 MG per tablet Take 1 tablet by mouth every 4 (four) hours as needed. Patient not taking: Reported on 12/08/2014 03/27/14   Lily Kocher, PA-C  ranitidine (ZANTAC) 150 MG tablet Take 150 mg by mouth every 4 (four) hours as needed for heartburn. Acid reflux    Historical Provider, MD  simvastatin (ZOCOR) 10 MG tablet Take 1 tablet (10 mg total) by mouth at bedtime. Patient not taking: Reported on 12/08/2014 03/10/13   Alycia Rossetti, MD  traZODone (DESYREL) 50 MG tablet Take 1-2 tablets at bedtime as needed for sleep Patient not taking: Reported on 12/08/2014 06/10/13   Alycia Rossetti, MD   BP 123/97 mmHg  Pulse 111  Temp(Src) 98 F (36.7 C) (Oral)  Resp 16  SpO2 100% Physical Exam  0405: Physical examination:  Nursing notes reviewed; Vital signs and O2 SAT reviewed;  Constitutional: Well developed, Well nourished, Well hydrated, In no acute distress; Head:  Normocephalic, atraumatic; Eyes: EOMI, PERRL, No scleral icterus; ENMT: Mouth and pharynx normal, Mucous membranes moist; Neck: Supple, Full range of motion, No lymphadenopathy; Cardiovascular: Regular rate and rhythm, No murmur, rub, or gallop; Respiratory: Breath sounds clear & equal bilaterally, No rales, rhonchi, wheezes.  Speaking full sentences with ease, Normal respiratory effort/excursion; Chest: Nontender, Movement normal; Abdomen: Soft, Nontender, Nondistended, Normal bowel sounds. +20x10cm area of right groin erythema, induration, and warmth, extending to proximal right upper anterior thigh, right upper scrotum and penis. +open wound medially with yellow drainage. No soft tissue crepitus. No perineal erythema. No blisters.;; Genitourinary: No CVA tenderness; Extremities: Pulses normal, No tenderness, No edema, No calf edema or asymmetry.; Neuro: AA&Ox3, Major  CN grossly intact.  Speech clear. No gross focal motor or sensory deficits in extremities.; Skin: Color normal, Warm, Dry.   ED Course  Procedures (including critical care time)  EMERGENCY DEPARTMENT US SOFT TISSUE INTERPRETATION "Study: Limited Ultrasound of the noted body part in comments below"  INDICATIONS: Soft tissue infection Multiple views of the body part are obtained with a multi-frequency linear probe  PERFORMED BY:  Myself  IMAGES ARCHIVED?: No  SIDE:Right   BODY PART:  right groin  FINDINGS: No abcess noted and Cellulitis present  LIMITATIONS:  Emergent Procedure  INTERPRETATION:  No abcess noted and Cellulitis present  COMMENT:  No discrete abscess, +cellulitis.      Labs Review Imaging Review  I have personally reviewed and evaluated these images and lab results as part of my medical decision-making.   EKG Interpretation None      MDM  MDM Reviewed: previous chart, nursing note and vitals Reviewed previous: labs Interpretation: labs and CT scan     Results for orders placed or performed during the hospital encounter of 0000000  Basic metabolic panel  Result Value Ref Range   Sodium 136 135 - 145 mmol/L   Potassium 3.8 3.5 - 5.1 mmol/L   Chloride 100 (L) 101 - 111 mmol/L   CO2 25 22 - 32 mmol/L   Glucose, Bld 123 (H) 65 - 99 mg/dL   BUN 11 6 - 20 mg/dL   Creatinine, Ser 1.00 0.61 - 1.24 mg/dL   Calcium 9.5 8.9 - 10.3 mg/dL   GFR calc non Af Amer >60 >60 mL/min   GFR calc Af Amer >60 >60 mL/min   Anion gap 11 5 - 15  Lactic acid, plasma  Result Value Ref Range   Lactic Acid, Venous 0.9 0.5 - 2.0 mmol/L  CBC with Differential  Result Value Ref Range   WBC 17.2 (H) 4.0 - 10.5 K/uL   RBC 4.78 4.22 - 5.81 MIL/uL   Hemoglobin 15.2 13.0 - 17.0 g/dL   HCT 42.6 39.0 - 52.0 %   MCV 89.1 78.0 - 100.0 fL   MCH 31.8 26.0 - 34.0 pg   MCHC 35.7 30.0 - 36.0 g/dL   RDW 12.2 11.5 - 15.5 %   Platelets 169 150 - 400 K/uL   Neutrophils Relative  % 84 %   Neutro Abs 14.3 (H) 1.7 - 7.7 K/uL   Lymphocytes Relative 10 %   Lymphs Abs 1.8 0.7 - 4.0 K/uL   Monocytes Relative 6 %   Monocytes Absolute 1.0 0.1 - 1.0 K/uL   Eosinophils Relative 0 %   Eosinophils Absolute 0.1 0.0 - 0.7 K/uL   Basophils Relative 0 %   Basophils Absolute 0.0 0.0 - 0.1 K/uL    Ct Pelvis W Contrast 08/11/2015  CLINICAL DATA:  Groin swelling for several days, possible abscess EXAM: CT PELVIS WITH CONTRAST TECHNIQUE: Multidetector CT imaging of the pelvis was performed using the standard protocol following the bolus administration of intravenous contrast. CONTRAST:  114mL OMNIPAQUE IOHEXOL 300 MG/ML  SOLN COMPARISON:  None. FINDINGS: Bladder is partially distended. No pelvic mass lesion or sidewall abnormality is noted. The appendix has been surgically removed. Mild aortoiliac calcifications are noted without aneurysmal dilatation. There is an area of induration in the right groin extending towards the scrotum without definitive focal fluid collection to suggest subcutaneous abscess. These changes are more worse consistent with cellulitis. Few reactive lymph nodes are noted in the right inguinal region. No other lymphadenopathy is seen. Bony structures are within normal limits. IMPRESSION: Induration consistent with cellulitis in the right groin extending inferiorly towards the scrotum. No definitive subcutaneous abscess is seen. Some reactive lymph nodes are noted. Electronically Signed   By: Inez Catalina M.D.   On: 08/11/2015 07:50    0730:  CT scan without abscess. IV clindamycin already dosed shortly after arrival to ED.  Dx and testing d/w pt and family.  Questions answered.  Verb understanding, agreeable to admit. T/C to [facility]. Sarajane Jews, case discussed, including:  HPI, pertinent PM/SHx, VS/PE, dx testing, ED course and treatment:  Agreeable to admit, requests to write temporary orders, obtain medical bed to team APAdmits.   Francine Graven, DO 08/14/15 1349

## 2015-08-11 NOTE — ED Notes (Signed)
Large red area noted to the right groin. Area is draining at this time.

## 2015-08-12 DIAGNOSIS — L03314 Cellulitis of groin: Principal | ICD-10-CM

## 2015-08-12 LAB — CBC
HEMATOCRIT: 39.6 % (ref 39.0–52.0)
Hemoglobin: 13.4 g/dL (ref 13.0–17.0)
MCH: 30.9 pg (ref 26.0–34.0)
MCHC: 33.8 g/dL (ref 30.0–36.0)
MCV: 91.2 fL (ref 78.0–100.0)
Platelets: 178 10*3/uL (ref 150–400)
RBC: 4.34 MIL/uL (ref 4.22–5.81)
RDW: 12.3 % (ref 11.5–15.5)
WBC: 9.9 10*3/uL (ref 4.0–10.5)

## 2015-08-12 MED ORDER — MORPHINE SULFATE (PF) 4 MG/ML IV SOLN: 4.0000 mg | INTRAVENOUS | Status: DC | PRN

## 2015-08-12 MED ORDER — MORPHINE SULFATE (PF) 4 MG/ML IV SOLN
4.0000 mg | INTRAVENOUS | Status: AC | PRN
  Administered 2015-08-12 (×2): 4 mg via INTRAVENOUS
  Filled 2015-08-12 (×2): qty 1

## 2015-08-12 MED ORDER — DOXYCYCLINE HYCLATE 100 MG IV SOLR
100.0000 mg | Freq: Two times a day (BID) | INTRAVENOUS | Status: DC
  Administered 2015-08-12: 100 mg via INTRAVENOUS
  Filled 2015-08-12 (×2): qty 100

## 2015-08-12 MED ORDER — MORPHINE SULFATE (PF) 4 MG/ML IV SOLN
4.0000 mg | INTRAVENOUS | Status: DC | PRN
  Administered 2015-08-12 – 2015-08-13 (×4): 4 mg via INTRAVENOUS
  Filled 2015-08-12 (×4): qty 1

## 2015-08-12 MED ORDER — DOXYCYCLINE HYCLATE 100 MG PO TABS
100.0000 mg | ORAL_TABLET | Freq: Two times a day (BID) | ORAL | Status: DC
  Administered 2015-08-12: 100 mg via ORAL
  Filled 2015-08-12: qty 1

## 2015-08-12 MED ORDER — DOXYCYCLINE HYCLATE 100 MG IV SOLR
INTRAVENOUS | Status: AC
  Filled 2015-08-12: qty 100

## 2015-08-12 NOTE — Consult Note (Signed)
Reason for Consult: Cellulitis, right groin Referring Physician: Hospitalist  Alec Carter is an 40 y.o. male.  HPI: Patient is a 41 year old white male who developed a small for multiple in the right groin region. He lanced this himself with a blade, though he reports no purulent fluid was obtained. As the swelling and tenderness got worse, he presented to the emergency room for further evaluation treatment. Ultrasound of this region as well as a CT scan of the pelvis was negative for abscess. He was admitted the hospital for IV antibiotics. He has been pushing on the area to express serosanguineous material. No purulent drainage was noted. He states he does feel better, though it is still tender to touch.  Past Medical History  Diagnosis Date  . Depression   . Anxiety   . Chronic back pain   . Reflux   . Bipolar 1 disorder ([place])   . Chest pain at rest, negative MI, r/o cardiac though anxiety may be component 04/10/2012  . Hypertension   . 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 Appendectomy      Family History  Problem Relation Age of Onset  . Diabetes Mother   . Depression Mother   . Diabetes Father   . Depression Father   . Asthma Sister   . Depression Sister   . Cancer Maternal Aunt     lung   . Heart disease Maternal Grandmother   . Hypertension Maternal Grandmother   . Diabetes Maternal Grandmother     Social History:  reports that he has quit smoking. His smoking use included Cigarettes. He smoked 0.50 packs per day. He does not have any smokeless tobacco history on file. He reports that he does not drink alcohol or use illicit drugs.  Allergies: No Known Allergies  Medications: I have reviewed the patient's current medications.  Results for orders placed or performed during the hospital encounter of 08/11/15 (from the past 48 hour(s))  Basic metabolic panel     Status: Abnormal   Collection Time: 08/11/15  4:15 AM   Result Value Ref Range   Sodium 136 135 - 145 mmol/L   Potassium 3.8 3.5 - 5.1 mmol/L   Chloride 100 (L) 101 - 111 mmol/L   CO2 25 22 - 32 mmol/L   Glucose, Bld 123 (H) 65 - 99 mg/dL   BUN 11 6 - 20 mg/dL   Creatinine, Ser 1.00 0.61 - 1.24 mg/dL   Calcium 9.5 8.9 - 10.3 mg/dL   GFR calc non Af Amer >60 >60 mL/min   GFR calc Af Amer >60 >60 mL/min    Comment: (NOTE) The eGFR has been calculated using the CKD EPI equation. This calculation has not been validated in all clinical situations. eGFR's persistently <60 mL/min signify possible Chronic Kidney Disease.    Anion gap 11 5 - 15  CBC with Differential     Status: Abnormal   Collection Time: 08/11/15  4:15 AM  Result Value Ref Range   WBC 17.2 (H) 4.0 - 10.5 K/uL   RBC 4.78 4.22 - 5.81 MIL/uL   Hemoglobin 15.2 13.0 - 17.0 g/dL   HCT 42.6 39.0 - 52.0 %   MCV 89.1 78.0 - 100.0 fL   MCH 31.8 26.0 - 34.0 pg   MCHC 35.7 30.0 - 36.0 g/dL   RDW 12.2 11.5 - 15.5 %   Platelets 169 150 - 400 K/uL  Neutrophils Relative % 84 %   Neutro Abs 14.3 (H) 1.7 - 7.7 K/uL   Lymphocytes Relative 10 %   Lymphs Abs 1.8 0.7 - 4.0 K/uL   Monocytes Relative 6 %   Monocytes Absolute 1.0 0.1 - 1.0 K/uL   Eosinophils Relative 0 %   Eosinophils Absolute 0.1 0.0 - 0.7 K/uL   Basophils Relative 0 %   Basophils Absolute 0.0 0.0 - 0.1 K/uL  Lactic acid, plasma     Status: None   Collection Time: 08/11/15  5:04 AM  Result Value Ref Range   Lactic Acid, Venous 0.9 0.5 - 2.0 mmol/L  Lactic acid, plasma     Status: None   Collection Time: 08/11/15  7:13 AM  Result Value Ref Range   Lactic Acid, Venous 0.8 0.5 - 2.0 mmol/L  CBC     Status: None   Collection Time: 08/12/15  6:10 AM  Result Value Ref Range   WBC 9.9 4.0 - 10.5 K/uL   RBC 4.34 4.22 - 5.81 MIL/uL   Hemoglobin 13.4 13.0 - 17.0 g/dL   HCT 39.6 39.0 - 52.0 %   MCV 91.2 78.0 - 100.0 fL   MCH 30.9 26.0 - 34.0 pg   MCHC 33.8 30.0 - 36.0 g/dL   RDW 12.3 11.5 - 15.5 %   Platelets 178 150  - 400 K/uL    Ct Pelvis W Contrast  08/11/2015  CLINICAL DATA:  Groin swelling for several days, possible abscess EXAM: CT PELVIS WITH CONTRAST TECHNIQUE: Multidetector CT imaging of the pelvis was performed using the standard protocol following the bolus administration of intravenous contrast. CONTRAST:  141m OMNIPAQUE IOHEXOL 300 MG/ML  SOLN COMPARISON:  None. FINDINGS: Bladder is partially distended. No pelvic mass lesion or sidewall abnormality is noted. The appendix has been surgically removed. Mild aortoiliac calcifications are noted without aneurysmal dilatation. There is an area of induration in the right groin extending towards the scrotum without definitive focal fluid collection to suggest subcutaneous abscess. These changes are more worse consistent with cellulitis. Few reactive lymph nodes are noted in the right inguinal region. No other lymphadenopathy is seen. Bony structures are within normal limits. IMPRESSION: Induration consistent with cellulitis in the right groin extending inferiorly towards the scrotum. No definitive subcutaneous abscess is seen. Some reactive lymph nodes are noted. Electronically Signed   By: Alec CatalinaM.D.   On: 08/11/2015 07:50    ROS: See chart Blood pressure 114/60, pulse 72, temperature 97.8 F (36.6 C), temperature source Oral, resp. rate 16, height 6' (1.829 m), weight 99.791 kg (220 lb), SpO2 100 %. Physical Exam: Pleasant white male in no acute distress. Skin examination reveals significantly indurated and erythematous tissue in the right pubic region with some extension to the scrotum. There is a small incision in the middle of this, with serosanguineous drainage present.  Assessment/Plan: Impression: Cellulitis of right groin, resolving. No abscess cavity present. No need for incision and drainage. Plan: Continue IV antibiotics. Will monitor with you.  Alec Carter A 08/12/2015, 11:00 AM

## 2015-08-12 NOTE — Progress Notes (Signed)
[facility] PROGRESS NOTE  Alec Carter [id number] DOB: March 13, 1976 DOA: 08/11/2015 PCP: Terald Sleeper, PA-C  Assessment/Plan: 1. Right groin cellulitis: area of fluctuance noted today and significant discharge (purulence noted on chuck pad patient has been using to collect discharge); changing antibiotic to doxycycline 100mg  as patient has purulent cellulitis, general surgery to see- will await recommendations as to whether or not I&D indicated, continue to monitor vital signs.   Code Status: full Family Communication: no family at bedside Disposition Plan: home   Consultants:  General Surgery  Procedures:  none  Antibiotics:  Doxycycline started today  Clindamycin s/p 4 doses (started 1/21- discontinued 1/22)  HPI/Subjective: Patient reports that he slept minimally last night as he was pushing on cellulitis to elicit drainage.  He notes he filled up 1 whole pad of drainage and that it is in the garbage.  Denies fevers, body aches.  States pain is about the same and that his pain increases with walking or turning over. Patient does not request additional pain medication.  Did receive tetanus immunization yesterday and underwent HIV testing as well.    Objective: Filed Vitals:   08/11/15 2156 08/12/15 0500  BP: 121/61 114/60  Pulse: 71 72  Temp: 98.5 F (36.9 C) 97.8 F (36.6 C)  Resp: 16 16    Intake/Output Summary (Last 24 hours) at 08/12/15 0913 Last data filed at 08/11/15 1818  Gross per 24 hour  Intake    480 ml  Output      0 ml  Net    480 ml   Filed Weights   08/11/15 1018  Weight: 99.791 kg (220 lb)    Exam:   General:  No acute distress laying comfortably in hospital bed  Cardiovascular: S1S2 RRR no murmurs or gallops  Respiratory: clear to auscultation bilaterally with no wheezing or rhonchi  Abdomen: mostly soft (with exception to lower right quadrant) with tenderness noted in right lower quadrant.  Right lower quadrant has erythema  that is only mildly improved from yesterday but area of induration does have a small area of fluctuance noted anterolaterally from the incision the patient had made to drain the area prior to admission.  Patient did apply pressure in a down and outward sweeping motion and was able to elicit bloody purulent discharge.  No crepitus noted  Musculoskeletal: FROM upper and lower extremities bilaterally   Data Reviewed: Basic Metabolic Panel:  Recent Labs Lab 08/11/15 0415  NA 136  K 3.8  CL 100*  CO2 25  GLUCOSE 123*  BUN 11  CREATININE 1.00  CALCIUM 9.5   Liver Function Tests: No results for input(s): AST, ALT, ALKPHOS, BILITOT, PROT, ALBUMIN in the last 168 hours. No results for input(s): LIPASE, AMYLASE in the last 168 hours. No results for input(s): AMMONIA in the last 168 hours. CBC:  Recent Labs Lab 08/11/15 0415 08/12/15 0610  WBC 17.2* 9.9  NEUTROABS 14.3*  --   HGB 15.2 13.4  HCT 42.6 39.6  MCV 89.1 91.2  PLT 169 178   Cardiac Enzymes: No results for input(s): CKTOTAL, CKMB, CKMBINDEX, TROPONINI in the last 168 hours. BNP (last 3 results) No results for input(s): BNP in the last 8760 hours.  ProBNP (last 3 results) No results for input(s): PROBNP in the last 8760 hours.  CBG: No results for input(s): GLUCAP in the last 168 hours.  No results found for this or any previous visit (from the past 240 hour(s)).   Studies: Ct Pelvis W  Contrast  08/11/2015  CLINICAL DATA:  Groin swelling for several days, possible abscess EXAM: CT PELVIS WITH CONTRAST TECHNIQUE: Multidetector CT imaging of the pelvis was performed using the standard protocol following the bolus administration of intravenous contrast. CONTRAST:  174mL OMNIPAQUE IOHEXOL 300 MG/ML  SOLN COMPARISON:  None. FINDINGS: Bladder is partially distended. No pelvic mass lesion or sidewall abnormality is noted. The appendix has been surgically removed. Mild aortoiliac calcifications are noted without aneurysmal  dilatation. There is an area of induration in the right groin extending towards the scrotum without definitive focal fluid collection to suggest subcutaneous abscess. These changes are more worse consistent with cellulitis. Few reactive lymph nodes are noted in the right inguinal region. No other lymphadenopathy is seen. Bony structures are within normal limits. IMPRESSION: Induration consistent with cellulitis in the right groin extending inferiorly towards the scrotum. No definitive subcutaneous abscess is seen. Some reactive lymph nodes are noted. Electronically Signed   By: Inez Catalina M.D.   On: 08/11/2015 07:50    Scheduled Meds: . docusate sodium  100 mg Oral BID  . doxycycline  100 mg Oral Q12H  . famotidine  20 mg Oral BID  . ketorolac  30 mg Intravenous 4 times per day  . sodium chloride  3 mL Intravenous Q12H   Continuous Infusions:   Active Problems:   Cellulitis of right groin   Cellulitis    Time spent: 30 minutes    Coralie Common  Resident Physician  [facility] 08/12/2015, 9:13 AM  LOS: 1 day

## 2015-08-13 LAB — HIV ANTIBODY (ROUTINE TESTING W REFLEX): HIV Screen 4th Generation wRfx: NONREACTIVE

## 2015-08-13 MED ORDER — DOXYCYCLINE HYCLATE 100 MG PO TABS: 100.0000 mg | ORAL_TABLET | Freq: Two times a day (BID) | ORAL | Status: DC

## 2015-08-13 MED ORDER — DOXYCYCLINE HYCLATE 100 MG PO TABS
100.0000 mg | ORAL_TABLET | Freq: Two times a day (BID) | ORAL | Status: DC
  Administered 2015-08-13: 100 mg via ORAL
  Filled 2015-08-13: qty 1

## 2015-08-13 NOTE — Final Progress Note (Signed)
Patient discharged with instructions, prescription, and care notes.  Verbalized understanding via teach back.  IV was removed and the site was WNL. Patient voiced no further complaints or concerns at the time of discharge.  Appointments scheduled per instructions.  Patient left the floor via w/c with staff and family in stable condition. 

## 2015-08-13 NOTE — Progress Notes (Signed)
Patient's right groin looks remarkably better today. Drainage has decreased. Okay for discharge from surgery standpoint. He will follow-up in my office as needed. Agree with ten-day course of oral doxycycline.

## 2015-08-13 NOTE — Discharge Summary (Signed)
Physician Discharge Summary  Alec Carter [id number] DOB: 1976/07/07 DOA: 08/11/2015  PCP: Terald Sleeper, PA-C  Admit date: 08/11/2015 Discharge date: 08/13/2015  Recommendations for Outpatient Follow-up:  1. Follow up with PCP in 3-4 days for wound recheck  Follow-up Information    Follow up with Terald Sleeper, PA-C. Schedule an appointment as soon as possible for a visit in 3 days.   Specialty:  General Practice   Why:  For wound re-check   Contact information:   [address] [place] [address] [phone]      Discharge Diagnoses:  1. Right groin cellulitis  Discharge Condition: Stable Disposition: Home  Diet recommendation: Regular Diet  Filed Weights   08/11/15 1018  Weight: 99.791 kg (220 lb)    History of present illness:  40 year old male with right groin pain that began about 5 days prior to hospital presentation. Patient initially noticed pain and redness while at work 5 days prior to admission. Upon arriving home he showered and used moist heat to help with the pain. He did not use any antibiotic creams, powders, ice nor did he elevate the scrotum. The pain and redness increased gradually as the work week went on and he ultimately called his physician 2-3 days after the pain and redness began for treatment. He was called in Bactrim and subsequently took two doses thus far of the Bactrim prior to the hospitalization. Yesterday patient used a new razor blade to lance the area and notes that blood and a dark brown liquid came out. He was unable to extract much mostly due to pain but mentions the bandage he put on the area did display thick white chunks when it was removed yesterday evening. This morning patient awoke with significant pain and redness to the area. He notes he was sweating profusely and felt nauseous and feverish. 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 The pain is worsened upon movement after long periods of inactivity as well as any activity. He has a history of cellulitis with possible abscess of the neck 2/2 ingrown hair about 3-4 months ago. Pain is 7/10 and slightly dulled after IV pain medication.  Hospital Course:  Patient was seen and evaluated in the ED and given IV Clindamycin.  He was admitted for right groin cellulitis and general surgery was consulted.  Pain control as with morphine and patient was given a tetanus booster as he had made an incision to drain the cellulitis and had not been vaccinated in over ten years.  He was transitioned to doxycycline on day two of admission as patient was expressing some drainage from the incision he had made in the induration in his groin.  General surgeon saw and evaluated patient and did not recommend I&D at that time.  Patient continued to improve on the doxycycline and his pain was well controlled with morphine and his chronic pain medication. Cellulitis has improved substantially and he was transitioned over to PO doxycycline and encouraged to follow up with his PCP prior to the week's end for wound re-evaluation.   Discharge Instructions   Current Discharge Medication List    CONTINUE these medications which have NOT CHANGED   Details  ALPRAZolam (XANAX) 0.5 MG tablet Take 0.5 mg by mouth 2 (two) times daily as needed for anxiety.    HYDROcodone-acetaminophen (NORCO) 10-325 MG per  tablet Take 1-2 tablets by mouth every 6 (six) hours as needed for moderate pain.     ranitidine (ZANTAC) 150 MG tablet Take 150 mg by mouth every 4 (four) hours as needed for heartburn. Acid reflux    sulfamethoxazole-trimethoprim (BACTRIM DS,SEPTRA DS) 800-160 MG tablet Take 1 tablet by mouth 2 (two) times daily.        No Known Allergies  The results of significant diagnostics from this hospitalization (including imaging, microbiology, ancillary and laboratory) are listed below for  reference.    Significant Diagnostic Studies: Ct Pelvis W Contrast  08/11/2015  CLINICAL DATA:  Groin swelling for several days, possible abscess EXAM: CT PELVIS WITH CONTRAST TECHNIQUE: Multidetector CT imaging of the pelvis was performed using the standard protocol following the bolus administration of intravenous contrast. CONTRAST:  174mL OMNIPAQUE IOHEXOL 300 MG/ML  SOLN COMPARISON:  None. FINDINGS: Bladder is partially distended. No pelvic mass lesion or sidewall abnormality is noted. The appendix has been surgically removed. Mild aortoiliac calcifications are noted without aneurysmal dilatation. There is an area of induration in the right groin extending towards the scrotum without definitive focal fluid collection to suggest subcutaneous abscess. These changes are more worse consistent with cellulitis. Few reactive lymph nodes are noted in the right inguinal region. No other lymphadenopathy is seen. Bony structures are within normal limits. IMPRESSION: Induration consistent with cellulitis in the right groin extending inferiorly towards the scrotum. No definitive subcutaneous abscess is seen. Some reactive lymph nodes are noted. Electronically Signed   By: Inez Catalina M.D.   On: 08/11/2015 07:50    Microbiology: No results found for this or any previous visit (from the past 240 hour(s)).   Labs: Basic Metabolic Panel:  Recent Labs Lab 08/11/15 0415  NA 136  K 3.8  CL 100*  CO2 25  GLUCOSE 123*  BUN 11  CREATININE 1.00  CALCIUM 9.5   Liver Function Tests: No results for input(s): AST, ALT, ALKPHOS, BILITOT, PROT, ALBUMIN in the last 168 hours. No results for input(s): LIPASE, AMYLASE in the last 168 hours. No results for input(s): AMMONIA in the last 168 hours. CBC:  Recent Labs Lab 08/11/15 0415 08/12/15 0610  WBC 17.2* 9.9  NEUTROABS 14.3*  --   HGB 15.2 13.4  HCT 42.6 39.6  MCV 89.1 91.2  PLT 169 178   Cardiac Enzymes: No results for input(s): CKTOTAL, CKMB,  CKMBINDEX, TROPONINI in the last 168 hours. BNP: BNP (last 3 results) No results for input(s): BNP in the last 8760 hours.  ProBNP (last 3 results) No results for input(s): PROBNP in the last 8760 hours.  CBG: No results for input(s): GLUCAP in the last 168 hours.  Principal Problem:   Cellulitis of right groin Active Problems:   Cellulitis   Time coordinating discharge: >30 minutes  Signed:  08/13/2015, 8:33 AM

## 2015-08-13 NOTE — Discharge Instructions (Signed)

## 2016-07-02 ENCOUNTER — Encounter: Payer: Self-pay | Admitting: Physician Assistant

## 2016-07-02 ENCOUNTER — Ambulatory Visit (INDEPENDENT_AMBULATORY_CARE_PROVIDER_SITE_OTHER): Payer: BLUE CROSS/BLUE SHIELD | Admitting: Physician Assistant

## 2016-07-02 VITALS — BP 126/86 | HR 85 | Temp 98.8°F | Resp 16 | Ht 73.0 in | Wt 232.0 lb

## 2016-07-02 DIAGNOSIS — R748 Abnormal levels of other serum enzymes: Secondary | ICD-10-CM | POA: Diagnosis not present

## 2016-07-02 DIAGNOSIS — G894 Chronic pain syndrome: Secondary | ICD-10-CM

## 2016-07-02 DIAGNOSIS — Z299 Encounter for prophylactic measures, unspecified: Secondary | ICD-10-CM | POA: Diagnosis not present

## 2016-07-02 DIAGNOSIS — F419 Anxiety disorder, unspecified: Secondary | ICD-10-CM | POA: Diagnosis not present

## 2016-07-02 LAB — CBC
HCT: 44.7 % (ref 39.0–52.0)
Hemoglobin: 15.4 g/dL (ref 13.0–17.0)
MCHC: 34.4 g/dL (ref 30.0–36.0)
MCV: 87.5 fl (ref 78.0–100.0)
Platelets: 226 10*3/uL (ref 150.0–400.0)
RBC: 5.11 Mil/uL (ref 4.22–5.81)
RDW: 13.3 % (ref 11.5–15.5)
WBC: 7 10*3/uL (ref 4.0–10.5)

## 2016-07-02 LAB — COMPREHENSIVE METABOLIC PANEL
ALT: 48 U/L (ref 0–53)
AST: 31 U/L (ref 0–37)
Albumin: 4.4 g/dL (ref 3.5–5.2)
Alkaline Phosphatase: 85 U/L (ref 39–117)
BUN: 10 mg/dL (ref 6–23)
CHLORIDE: 101 meq/L (ref 96–112)
CO2: 32 meq/L (ref 19–32)
CREATININE: 0.96 mg/dL (ref 0.40–1.50)
Calcium: 9.3 mg/dL (ref 8.4–10.5)
GFR: 91.01 mL/min (ref >60.00)
GLUCOSE: 98 mg/dL (ref 70–99)
Potassium: 3.9 mEq/L (ref 3.5–5.1)
SODIUM: 140 meq/L (ref 135–145)
Total Bilirubin: 0.5 mg/dL (ref 0.2–1.2)
Total Protein: 6.9 g/dL (ref 6.0–8.3)

## 2016-07-02 LAB — TSH: TSH: 0.95 u[IU]/mL (ref 0.35–4.50)

## 2016-07-02 NOTE — Patient Instructions (Signed)
Please go to the lab for blood work. I will call you with your results.   You will be contacted for assessment by a pain specialist.  They will be taking over medication as we are not a chronic pain clinic. Do not take more than as directed. Continue stretches.   Follow-up with me will be based on your lab results. Please schedule a complete physical at your earliest convenience.

## 2016-07-02 NOTE — Progress Notes (Signed)
Pre visit review using our clinic review tool, if applicable. No additional management support is needed unless otherwise documented below in the visit note. 

## 2016-07-02 NOTE — Progress Notes (Signed)
Patient presents to clinic today to establish care.  Acute Concerns: Denies acute concerns at today's visit.   Chronic Issues: Chronic Back Pain -- 2008 Injury at work while doing heavy lifting. Issues since then. + history of injections with reactions. Did see Neurosurgery for this initially. Was also followed by Dr. Nelva Bush in pain management initially.  PT prior but without results. Most recently treatment by previous PCP. Has been taking Oxycodone for about 6 months -- taking 10 mg every 6-8 hours.  Per Care Everywhere records, was just evaluated by Dr. Darral Dash -- Pain Specialist in [place]. Was started on Lyrica  Ut discontinued due to side effects. Specialist refused to continue Oxycodone due to concomittent use of Xanax.   Anxiety -- Endorses acute anxiety, mainly stemming from work stressors. Denies generalized anxiety but notes acute intense anxiety. Has prescription from Xanax given by his previous PCP. Averages using 1 tablet per week.   Past Medical History:  Diagnosis Date  . Anxiety   . Bipolar 1 disorder ([place])   . Blind left eye    injury age 40  . Chest pain at rest, negative MI, r/o cardiac though anxiety may be component 04/10/2012  . Chronic back pain   . Depression   . Hypertension   . Reflux     Past Surgical History:  Procedure Laterality Date  . APPENDECTOMY      Current Outpatient Prescriptions on File Prior to Visit  Medication Sig Dispense Refill  . ALPRAZolam (XANAX) 0.5 MG tablet Take 0.5 mg by mouth 3 (three) times daily as needed for anxiety.      No current facility-administered medications on file prior to visit.     No Known Allergies  Family History  Problem Relation Age of Onset  . Diabetes Mother   . Depression Mother   . Diabetes Father   . Depression Father   . Asthma Sister   . Depression Sister   . Cancer Maternal Aunt     lung   . Heart disease Maternal Grandmother   . Hypertension Maternal Grandmother   . Diabetes Maternal  Grandmother     Social History   Social History  . Marital status: Married    Spouse name: N/A  . Number of children: N/A  . Years of education: N/A   Occupational History  . Not on file.   Social History Main Topics  . Smoking status: Former Smoker    Packs/day: 0.50    Types: Cigarettes  . Smokeless tobacco: Never Used  . Alcohol use No  . Drug use: No  . Sexual activity: Yes   Other Topics Concern  . Not on file   Social History Narrative  . No narrative on file   Review of Systems  Respiratory: Negative for cough and shortness of breath.   Cardiovascular: Negative for chest pain and palpitations.  Musculoskeletal: Positive for back pain and joint pain. Negative for falls and myalgias.  Neurological: Negative for dizziness and loss of consciousness.  Psychiatric/Behavioral: Negative for depression, hallucinations, memory loss, substance abuse and suicidal ideas. The patient is nervous/anxious and has insomnia.     BP 126/86   Pulse 85   Temp 98.8 F (37.1 C) (Oral)   Resp 16   Ht 6\' 1"  (1.854 m)   Wt 232 lb (105.2 kg)   SpO2 97%   BMI 30.61 kg/m   Physical Exam  Constitutional: He is oriented to person, place, and time and well-developed, well-nourished, and in  no distress.  HENT:  Head: Normocephalic and atraumatic.  Eyes: Conjunctivae are normal. Pupils are equal, round, and reactive to light.  Neck: Neck supple. No thyromegaly present.  Cardiovascular: Normal rate, regular rhythm, normal heart sounds and intact distal pulses.   Pulmonary/Chest: Effort normal and breath sounds normal. No respiratory distress. He has no wheezes. He has no rales. He exhibits no tenderness.  Lymphadenopathy:    He has no cervical adenopathy.  Neurological: He is alert and oriented to person, place, and time.  Skin: Skin is warm and dry. No rash noted.  Psychiatric: Affect normal.  Vitals reviewed.   Assessment/Plan: Anxiety disorder Rare use of Xanax. Doing well  overall. Averages 1 tablet use per week per patient. No refills needed at present. Discussed with patient that if we take over would be a limited quantity with rare use as he is on chronic pain medication. Will have patient sign CSC and give UDS at time of Rx.   Elevated liver enzymes, persistent Prior history. Will repeat LFTs today.  Chronic pain disorder Discussed with patient that we will not be taking over chronic pain management. Referral placed to [facility].     Leeanne Rio, PA-C

## 2016-07-03 ENCOUNTER — Encounter: Payer: Self-pay | Admitting: Emergency Medicine

## 2016-07-13 NOTE — Assessment & Plan Note (Signed)
Discussed with patient that we will not be taking over chronic pain management. Referral placed to CPS.

## 2016-07-13 NOTE — Assessment & Plan Note (Signed)
Rare use of Xanax. Doing well overall. Averages 1 tablet use per week per patient. No refills needed at present. Discussed with patient that if we take over would be a limited quantity with rare use as he is on chronic pain medication. Will have patient sign CSC and give UDS at time of Rx.

## 2016-07-13 NOTE — Assessment & Plan Note (Signed)
Prior history. Will repeat LFTs today.

## 2016-07-25 ENCOUNTER — Telehealth: Payer: Self-pay | Admitting: Physician Assistant

## 2016-07-25 NOTE — Telephone Encounter (Signed)
Please advise of pain medication. He has been referred to pain management but is in the process.

## 2016-07-25 NOTE — Telephone Encounter (Signed)
Pt needs refill on Oxycodone, Pt states that his pcp wrote 1/2 Rx to give pt time to find a new pcp. Pt states that he has been out for 5 days now, referral for pain management is scheduling out 4 wks before pt will get in.

## 2016-07-28 MED ORDER — OXYCODONE HCL 10 MG PO TABS: ORAL_TABLET | ORAL | 0 refills | Status: DC

## 2016-07-28 NOTE — Telephone Encounter (Signed)
Will allow one-time refill of 120 tablets until appointment.  Patient can pick up at office.      Indication for chronic opioid: chronic back pain, OA multiple sites Medication and dose: Oxycodone 10 mg. # pills per month: Normally 240 per last PMD. Will allow 120 max while waiting on pain management.  Last UDS date: None obtained yet.  Pain contract signed (Y/N): Referral to Pain management placed. This will be a one-time Rx for chronic medication.  Date narcotic database last reviewed (include red flags): 07/26/16. No red flags noted.

## 2016-07-28 NOTE — Telephone Encounter (Signed)
LMOVM advising patient rx is ready for pick up. This will be a one time refill until scheduled with pain management

## 2016-08-08 ENCOUNTER — Ambulatory Visit: Payer: BLUE CROSS/BLUE SHIELD | Admitting: Family Medicine

## 2016-08-21 ENCOUNTER — Other Ambulatory Visit: Payer: Self-pay | Admitting: Physician Assistant

## 2016-08-21 NOTE — Telephone Encounter (Signed)
Indication for chronic opioid: Chronic Pain Syndrome  Medication and dose: Oxycodone 10mg  # pills per month: #120 0RF Last UDS date: N Pain contract signed (Y/N): N Date narcotic database last reviewed (include red flags):   Pain management referral still pending.

## 2016-08-22 MED ORDER — OXYCODONE HCL 10 MG PO TABS: ORAL_TABLET | ORAL | 0 refills | Status: DC

## 2016-08-22 NOTE — Telephone Encounter (Signed)
LMOVM advising patient rx for Oxycodone is ready for pick up at front desk.

## 2016-08-26 ENCOUNTER — Telehealth: Payer: Self-pay | Admitting: Physician Assistant

## 2016-08-26 NOTE — Telephone Encounter (Signed)
[facility] not [facility]. We are still waiting for an appt with them. I will call and check status on this.

## 2016-08-26 NOTE — Telephone Encounter (Signed)
Received letter from Pain Management -- [facility]. They have been trying to reach patient to schedule an appointment without success.  I am hoping at this point he has heard from them and scheduled. If not, he needs to call at [phone] to schedule appointment.

## 2016-08-28 ENCOUNTER — Telehealth: Payer: Self-pay | Admitting: Physician Assistant

## 2016-08-28 NOTE — Telephone Encounter (Signed)
Noted  

## 2016-08-28 NOTE — Telephone Encounter (Signed)
FYI,Spoke with Alec Carter at [facility], Pt has been scheduled for 3/15 @ 1:00 in their [facility] location . Phone # [phone].  Pt is aware of appt date and time.

## 2016-09-14 ENCOUNTER — Other Ambulatory Visit: Payer: Self-pay | Admitting: Physician Assistant

## 2016-09-15 ENCOUNTER — Other Ambulatory Visit: Payer: Self-pay | Admitting: Physician Assistant

## 2016-09-16 NOTE — Telephone Encounter (Signed)
Indication for chronic opioid:Chronic pain Medication and dose: Oxycodone 10 mg # pills per month: #120 on 08/22/16 Last UDS date: None Pain contract signed (Y/N): No referred to Pain Specialist Date narcotic database last reviewed (include red flags):   Appointment with [facility] on 10/02/16 @ 2p

## 2016-09-18 MED ORDER — OXYCODONE HCL 10 MG PO TABS: ORAL_TABLET | ORAL | 0 refills | Status: DC

## 2016-09-18 NOTE — Telephone Encounter (Signed)
LMOVM advising patient rx for Oxycodone is ready for pick up at front desk. Advised to keep scheduled appt with pain specialist for management

## 2016-09-18 NOTE — Telephone Encounter (Signed)
Will give 60 tablets. I agreed to take over only until he could see a new pain specialist.  Recommend he does not cancel his appointment with them.

## 2016-09-23 ENCOUNTER — Telehealth: Payer: Self-pay | Admitting: *Deleted

## 2016-09-23 NOTE — Telephone Encounter (Signed)
Received paperwork asking that a PA be done for Oxycodone  Because the limit for patient had been exceeded.     We do not do PA's on this medication, so I called [facility] as that is what is listed with the patient's pharmacy  - The gentleman in that pharmacy said that he would not fill anymore prescriptions for this patient because he is trying to get narcotics from multiple pharmacies.  [facility] is also listed in his list of pharmacies, but the PA came from [facility] -    Per Einar Pheasant, patient will not receive anymore pain medication from our office - he will have to keep his appointment with pain management.   Per DPR, messages can be left on patient's VM - I left a message letting patient know this.   Patient returned call and stated that he is now using [facility] because it is no longer convenient to [facility]" [facility].  He said that he did pay out of pocket for the current oxycodone rx because his insurance requires PA and "he did not want to wait around on that so he told [facility] not to bill insurance, that he would just pay for it"  Patient was informed that we can no longer receive pain medication from our office, and that he will have to keep his appointment with pain management. He stated verbal understanding.

## 2016-10-02 ENCOUNTER — Telehealth: Payer: Self-pay | Admitting: Physician Assistant

## 2016-10-02 NOTE — Telephone Encounter (Signed)
Patient states he has just left office seeing pain management doctor.  He states the provider there told him she will not refill any meds until his follow up appt in 4 weeks.  This is to allow time to receive and review all his records and results of testing done today.  He states he was told to reach out to pcp during this 4 week period if he needed his pain meds refilled.  Patient states pcp told him that he would no longer refill any pain meds since he was being referred to pain management.  He is asking if he needs an appt with pcp to discuss.  Please advise.

## 2016-10-02 NOTE — Telephone Encounter (Signed)
I will provide him with 1 month of scripts until they take over. Ok to print Rx.

## 2016-10-03 MED ORDER — OXYCODONE HCL 10 MG PO TABS: ORAL_TABLET | ORAL | 0 refills | Status: DC

## 2016-10-03 NOTE — Telephone Encounter (Signed)
LMOVM advising patient rx for Oxycodone is ready for pickup at the front desk.

## 2017-01-27 ENCOUNTER — Emergency Department (HOSPITAL_COMMUNITY): Payer: BLUE CROSS/BLUE SHIELD

## 2017-01-27 ENCOUNTER — Emergency Department (HOSPITAL_COMMUNITY)
Admission: EM | Admit: 2017-01-27 | Discharge: 2017-01-27 | Disposition: A | Discharge status: Home / Self Care | Payer: BLUE CROSS/BLUE SHIELD | Attending: Emergency Medicine | Admitting: Emergency Medicine

## 2017-01-27 ENCOUNTER — Encounter (HOSPITAL_COMMUNITY): Payer: Self-pay

## 2017-01-27 DIAGNOSIS — Z87891 Personal history of nicotine dependence: Secondary | ICD-10-CM | POA: Diagnosis not present

## 2017-01-27 DIAGNOSIS — I10 Essential (primary) hypertension: Secondary | ICD-10-CM | POA: Insufficient documentation

## 2017-01-27 DIAGNOSIS — R0782 Intercostal pain: Secondary | ICD-10-CM | POA: Diagnosis not present

## 2017-01-27 DIAGNOSIS — R0781 Pleurodynia: Secondary | ICD-10-CM

## 2017-01-27 LAB — URINALYSIS, ROUTINE W REFLEX MICROSCOPIC
Bilirubin Urine: NEGATIVE
GLUCOSE, UA: NEGATIVE mg/dL
Hgb urine dipstick: NEGATIVE
Ketones, ur: NEGATIVE mg/dL
LEUKOCYTES UA: NEGATIVE
NITRITE: NEGATIVE
PH: 7 (ref 5.0–8.0)
Protein, ur: NEGATIVE mg/dL
SPECIFIC GRAVITY, URINE: 1.018 (ref 1.005–1.030)

## 2017-01-27 MED ORDER — HYDROCODONE-ACETAMINOPHEN 5-325 MG PO TABS: ORAL_TABLET | ORAL | 0 refills | Status: DC

## 2017-01-27 MED ORDER — IBUPROFEN 600 MG PO TABS: 600.0000 mg | ORAL_TABLET | Freq: Four times a day (QID) | ORAL | 0 refills | Status: DC | PRN

## 2017-01-27 NOTE — ED Triage Notes (Signed)
Pt reports he sneezed and coughed hard yesterday approv 1130 and left side of back began hurting. Hurts to deep breathe

## 2017-01-27 NOTE — ED Provider Notes (Signed)
[facility] DEPT Provider Note   CSN: [id number] Arrival date & time: 01/27/17  0815     History   Chief Complaint Chief Complaint  Patient presents with  . Back Pain    HPI Alec Carter is a 41 y.o. male.  HPI   Alec Carter is a 41 y.o. male who presents to the Emergency Department complaining of sudden onset of left rib pain after developed after several episodes of forceful coughing and a "hard sneeze."  Symptoms began yesterday and have worsened today. He states that he felt a sharp pain to the lateral ribs that is worse with deep breathing, movement, coughing, and lying flat. Pain improved somewhat at rest while in the upright position. He denies swelling, shortness of breath,abdominal pain, urine changes or skin changes.  Past Medical History:  Diagnosis Date  . Anxiety   . Bipolar 1 disorder ([place])   . Blind left eye    injury age 49  . Chest pain at rest, negative MI, r/o cardiac though anxiety may be component 04/10/2012  . Chronic back pain   . Depression   . Hypertension   . Kidney stones   . Reflux     Patient Active Problem List   Diagnosis Date Noted  . Chronic pain disorder 06/11/2013  . Insomnia 06/11/2013  . Hepatic steatosis 05/18/2013  . Bipolar 1 disorder ([place]) 03/01/2013  . Polycythemia 04/09/2012  . Multiple pulmonary nodules 04/09/2012  . Elevated liver enzymes, persistent 04/09/2012  . Anxiety disorder 04/09/2012    Past Surgical History:  Procedure Laterality Date  . APPENDECTOMY         Home Medications    Prior to Admission medications   Medication Sig Start Date End Date Taking? Authorizing Provider  ranitidine (ZANTAC) 75 MG tablet Take 75 mg by mouth 2 (two) times daily.   Yes [provider]    Family History Family History  Problem Relation Age of Onset  . Diabetes Mother   . Depression Mother   . Diabetes Father   . Depression Father   . Asthma Sister   . Depression Sister   . Cancer Maternal Aunt          lung   . Heart disease Maternal Grandmother   . Hypertension Maternal Grandmother   . Diabetes Maternal Grandmother     Social History Social History  Substance Use Topics  . Smoking status: Former Smoker    Packs/day: 0.50    Types: Cigarettes  . Smokeless tobacco: Never Used  . Alcohol use No     Allergies   Patient has no known allergies.   Review of Systems Review of Systems  Constitutional: Negative for appetite change, chills and fever.  HENT: Negative for congestion and sore throat.   Respiratory: Negative for cough, chest tightness, shortness of breath and wheezing.   Cardiovascular: Positive for chest pain (Left rib pain).  Gastrointestinal: Negative for abdominal pain, nausea and vomiting.  Genitourinary: Negative for difficulty urinating and flank pain.  Musculoskeletal: Negative for back pain and neck pain.  Skin: Negative for rash.  Neurological: Negative for dizziness, weakness and headaches.  Psychiatric/Behavioral: Negative for confusion.     Physical Exam Updated Vital Signs BP (!) 128/100 (BP Location: Left Arm)   Pulse 99   Temp 97.7 F (36.5 C) (Oral)   Resp 19   Ht 6' (1.829 m)   Wt 99.8 kg (220 lb)   SpO2 98%   BMI 29.84 kg/m  Physical Exam  Constitutional: He is oriented to person, place, and time. He appears well-developed and well-nourished. No distress.  HENT:  Head: Atraumatic.  Mouth/Throat: Oropharynx is clear and moist.  Cardiovascular: Normal rate, regular rhythm and intact distal pulses.   Pulmonary/Chest: Effort normal and breath sounds normal. No respiratory distress. He exhibits tenderness (Focal tenderness to palpation of the lower left lateral ribs. No crepitus.).  Abdominal: Soft. He exhibits no distension. There is no tenderness. There is no guarding.  Musculoskeletal: Normal range of motion. He exhibits no edema.  Neurological: He is alert and oriented to person, place, and time. No sensory deficit.  Skin: Skin  is warm. Capillary refill takes less than 2 seconds. No rash noted.  Nursing note and vitals reviewed.    ED Treatments / Results  Labs (all labs ordered are listed, but only abnormal results are displayed) Labs Reviewed  URINALYSIS, ROUTINE W REFLEX MICROSCOPIC - Abnormal; Notable for the following:       Result Value   APPearance HAZY (*)    All other components within normal limits    EKG  EKG Interpretation None       Radiology Dg Ribs Unilateral W/chest Left  Result Date: 01/27/2017 CLINICAL DATA:  Rib pain after coughing. EXAM: LEFT RIBS AND CHEST - 3+ VIEW COMPARISON:  CT chest 06/10/2013 FINDINGS: No fracture or other bone lesions are seen involving the ribs. There is no evidence of pneumothorax or pleural effusion. Both lungs are clear. Heart size and mediastinal contours are within normal limits. IMPRESSION: Negative. Electronically Signed   By: Kathreen Devoid   On: 01/27/2017 10:15    Procedures Procedures (including critical care time)  Medications Ordered in ED Medications - No data to display   Initial Impression / Assessment and Plan / ED Course  I have reviewed the triage vital signs and the nursing notes.  Pertinent labs & imaging results that were available during my care of the patient were reviewed by me and considered in my medical decision making (see chart for details).     Patient well appearing. Vital signs are stable. No hypoxia, kidney or tachycardia. Focal tenderness to palpation of the lateral ribs, discussed possible occult fracture. Patient appears stable for discharge, return precautions discussed.  Final Clinical Impressions(s) / ED Diagnoses   Final diagnoses:  Rib pain on left side    New Prescriptions New Prescriptions   No medications on file     Kem Parkinson, Hershal Coria 01/27/17 1050    Milton Ferguson, MD 01/27/17 1433

## 2017-01-27 NOTE — Discharge Instructions (Signed)
You can apply ice packs on/off to the affected area.  Take several slow, deep breaths several times a day.  Follow-up with your provider for recheck or return here for any sudden shortness of breath or increased pain

## 2017-11-22 ENCOUNTER — Other Ambulatory Visit: Payer: Self-pay

## 2017-11-22 ENCOUNTER — Emergency Department (HOSPITAL_COMMUNITY): Payer: Self-pay

## 2017-11-22 ENCOUNTER — Encounter (HOSPITAL_COMMUNITY): Payer: Self-pay | Admitting: Emergency Medicine

## 2017-11-22 ENCOUNTER — Emergency Department (HOSPITAL_COMMUNITY)
Admission: EM | Admit: 2017-11-22 | Discharge: 2017-11-22 | Disposition: A | Discharge status: Home / Self Care | Payer: Self-pay | Attending: Emergency Medicine | Admitting: Emergency Medicine

## 2017-11-22 DIAGNOSIS — N23 Unspecified renal colic: Secondary | ICD-10-CM | POA: Insufficient documentation

## 2017-11-22 DIAGNOSIS — Z79899 Other long term (current) drug therapy: Secondary | ICD-10-CM | POA: Insufficient documentation

## 2017-11-22 DIAGNOSIS — R109 Unspecified abdominal pain: Secondary | ICD-10-CM

## 2017-11-22 DIAGNOSIS — I10 Essential (primary) hypertension: Secondary | ICD-10-CM | POA: Insufficient documentation

## 2017-11-22 DIAGNOSIS — Z87891 Personal history of nicotine dependence: Secondary | ICD-10-CM | POA: Insufficient documentation

## 2017-11-22 LAB — URINALYSIS, ROUTINE W REFLEX MICROSCOPIC
BILIRUBIN URINE: NEGATIVE
Glucose, UA: 100 mg/dL — AB
NITRITE: POSITIVE — AB
Protein, ur: 100 mg/dL — AB
pH: 6.5 (ref 5.0–8.0)

## 2017-11-22 LAB — CBC
HCT: 44.1 % (ref 39.0–52.0)
HEMOGLOBIN: 15.4 g/dL (ref 13.0–17.0)
MCH: 32 pg (ref 26.0–34.0)
MCHC: 34.9 g/dL (ref 30.0–36.0)
MCV: 91.7 fL (ref 78.0–100.0)
PLATELETS: 143 10*3/uL — AB (ref 150–400)
RBC: 4.81 MIL/uL (ref 4.22–5.81)
RDW: 13 % (ref 11.5–15.5)
WBC: 7.3 10*3/uL (ref 4.0–10.5)

## 2017-11-22 LAB — BASIC METABOLIC PANEL
ANION GAP: 7 (ref 5–15)
BUN: 11 mg/dL (ref 6–20)
CHLORIDE: 104 mmol/L (ref 101–111)
CO2: 26 mmol/L (ref 22–32)
Calcium: 8.5 mg/dL — ABNORMAL LOW (ref 8.9–10.3)
Creatinine, Ser: 0.92 mg/dL (ref 0.61–1.24)
Glucose, Bld: 98 mg/dL (ref 65–99)
POTASSIUM: 3.4 mmol/L — AB (ref 3.5–5.1)
SODIUM: 137 mmol/L (ref 135–145)

## 2017-11-22 LAB — URINALYSIS, MICROSCOPIC (REFLEX)

## 2017-11-22 MED ORDER — HYDROCODONE-ACETAMINOPHEN 5-325 MG PO TABS: 2.0000 | ORAL_TABLET | ORAL | 0 refills | Status: DC | PRN

## 2017-11-22 MED ORDER — ONDANSETRON HCL 4 MG/2ML IJ SOLN
4.0000 mg | Freq: Once | INTRAMUSCULAR | Status: AC
  Administered 2017-11-22: 4 mg via INTRAVENOUS
  Filled 2017-11-22: qty 2

## 2017-11-22 MED ORDER — SODIUM CHLORIDE 0.9 % IV BOLUS
1000.0000 mL | Freq: Once | INTRAVENOUS | Status: AC
  Administered 2017-11-22: 1000 mL via INTRAVENOUS

## 2017-11-22 MED ORDER — CEFTRIAXONE SODIUM 1 G IJ SOLR
1.0000 g | Freq: Once | INTRAMUSCULAR | Status: AC
  Administered 2017-11-22: 1 g via INTRAVENOUS
  Filled 2017-11-22: qty 10

## 2017-11-22 MED ORDER — SODIUM CHLORIDE 0.9 % IV SOLN
INTRAVENOUS | Status: DC
  Administered 2017-11-22: 20:00:00 via INTRAVENOUS

## 2017-11-22 MED ORDER — HYDROMORPHONE HCL 1 MG/ML IJ SOLN
1.0000 mg | Freq: Once | INTRAMUSCULAR | Status: AC
  Administered 2017-11-22: 1 mg via INTRAVENOUS
  Filled 2017-11-22: qty 1

## 2017-11-22 MED ORDER — KETOROLAC TROMETHAMINE 30 MG/ML IJ SOLN
30.0000 mg | Freq: Once | INTRAMUSCULAR | Status: AC
  Administered 2017-11-22: 30 mg via INTRAVENOUS
  Filled 2017-11-22: qty 1

## 2017-11-22 MED ORDER — NAPROXEN 500 MG PO TABS: 500.0000 mg | ORAL_TABLET | Freq: Two times a day (BID) | ORAL | 0 refills | Status: DC

## 2017-11-22 MED ORDER — PROMETHAZINE HCL 25 MG PO TABS: 25.0000 mg | ORAL_TABLET | Freq: Four times a day (QID) | ORAL | 1 refills | Status: AC | PRN

## 2017-11-22 MED ORDER — CEPHALEXIN 500 MG PO CAPS: 500.0000 mg | ORAL_CAPSULE | Freq: Four times a day (QID) | ORAL | 0 refills | Status: DC

## 2017-11-22 NOTE — ED Triage Notes (Signed)
3 previous kidney stones  No urologist  1 hr history of L sided flank pain

## 2017-11-22 NOTE — ED Provider Notes (Signed)
[facility] EMERGENCY DEPARTMENT Provider Note   CSN: [id number] Arrival date & time: 11/22/17  1815     History   Chief Complaint Chief Complaint  Patient presents with  . Flank Pain    HPI Alec Carter is a 42 y.o. male.  Patient with a history of kidney stones last time about 2009 about an hour and half prior to arrival developed left flank pain.  Associated with nausea no vomiting did have hematuria.  Radiates into his left testicle area.  Pain is 10 out of 10.     Past Medical History:  Diagnosis Date  . Anxiety   . Bipolar 1 disorder ([employer])   . Blind left eye    injury age 35  . Chest pain at rest, negative MI, r/o cardiac though anxiety may be component 04/10/2012  . Chronic back pain   . Depression   . Hypertension   . Kidney stones   . Reflux     Patient Active Problem List   Diagnosis Date Noted  . Chronic pain disorder 06/11/2013  . Insomnia 06/11/2013  . Hepatic steatosis 05/18/2013  . Bipolar 1 disorder ([place]) 03/01/2013  . Polycythemia 04/09/2012  . Multiple pulmonary nodules 04/09/2012  . Elevated liver enzymes, persistent 04/09/2012  . Anxiety disorder 04/09/2012    Past Surgical History:  Procedure Laterality Date  . APPENDECTOMY          Home Medications    Prior to Admission medications   Medication Sig Start Date End Date Taking? Authorizing Provider  ranitidine (ZANTAC) 150 MG capsule Take 150 mg by mouth as needed for heartburn.   Yes [provider]  cephALEXin (KEFLEX) 500 MG capsule Take 1 capsule (500 mg total) by mouth 4 (four) times daily. 11/22/17   Fredia Sorrow, MD  HYDROcodone-acetaminophen (NORCO/VICODIN) 5-325 MG tablet Take 2 tablets by mouth every 4 (four) hours as needed. 11/22/17   Fredia Sorrow, MD  naproxen (NAPROSYN) 500 MG tablet Take 1 tablet (500 mg total) by mouth 2 (two) times daily. 11/22/17   Fredia Sorrow, MD  promethazine (PHENERGAN) 25 MG tablet Take 1 tablet (25 mg total) by mouth every 6  (six) hours as needed. 11/22/17   Fredia Sorrow, MD    Family History Family History  Problem Relation Age of Onset  . Diabetes Mother   . Depression Mother   . Diabetes Father   . Depression Father   . Asthma Sister   . Depression Sister   . Cancer Maternal Aunt        lung   . Heart disease Maternal Grandmother   . Hypertension Maternal Grandmother   . Diabetes Maternal Grandmother     Social History Social History   Tobacco Use  . Smoking status: Former Smoker    Packs/day: 0.50    Types: Cigarettes  . Smokeless tobacco: Never Used  Substance Use Topics  . Alcohol use: No  . Drug use: No     Allergies   Patient has no known allergies.   Review of Systems Review of Systems  Constitutional: Negative for fever.  HENT: Negative for congestion.   Eyes: Negative for redness.  Respiratory: Negative for shortness of breath.   Cardiovascular: Negative for chest pain.  Gastrointestinal: Negative for abdominal pain.  Genitourinary: Positive for flank pain and hematuria.  Musculoskeletal: Negative for back pain.  Skin: Negative for rash.  Neurological: Negative for syncope.  Hematological: Does not bruise/bleed easily.  Psychiatric/Behavioral: Negative for confusion.  Physical Exam Updated Vital Signs BP 120/66 (BP Location: Right Arm)   Pulse 76   Temp 97.6 F (36.4 C) (Oral)   Resp 18   Ht 1.829 m (6')   Wt 90.7 kg (200 lb)   SpO2 100%   BMI 27.12 kg/m   Physical Exam  Constitutional: He is oriented to person, place, and time. He appears well-developed and well-nourished. He appears distressed.  HENT:  Head: Normocephalic and atraumatic.  Mouth/Throat: Oropharynx is clear and moist.  Eyes: Pupils are equal, round, and reactive to light. Conjunctivae and EOM are normal.  Neck: Neck supple.  Cardiovascular: Normal rate, regular rhythm and normal heart sounds.  Pulmonary/Chest: Effort normal and breath sounds normal. No respiratory distress.    Abdominal: Soft. Bowel sounds are normal. There is no tenderness.  Musculoskeletal: Normal range of motion.  Neurological: He is alert and oriented to person, place, and time. No cranial nerve deficit or sensory deficit. He exhibits normal muscle tone. Coordination normal.  Skin: Skin is warm.  Nursing note and vitals reviewed.    ED Treatments / Results  Labs (all labs ordered are listed, but only abnormal results are displayed) Labs Reviewed  URINALYSIS, ROUTINE W REFLEX MICROSCOPIC - Abnormal; Notable for the following components:      Result Value   Color, Urine AMBER (*)    APPearance TURBID (*)    Specific Gravity, Urine >1.030 (*)    Glucose, UA 100 (*)    Hgb urine dipstick LARGE (*)    Ketones, ur TRACE (*)    Protein, ur 100 (*)    Nitrite POSITIVE (*)    Leukocytes, UA TRACE (*)    All other components within normal limits  CBC - Abnormal; Notable for the following components:   Platelets 143 (*)    All other components within normal limits  BASIC METABOLIC PANEL - Abnormal; Notable for the following components:   Potassium 3.4 (*)    Calcium 8.5 (*)    All other components within normal limits  URINALYSIS, MICROSCOPIC (REFLEX) - Abnormal; Notable for the following components:   Bacteria, UA FEW (*)    All other components within normal limits  URINE CULTURE    EKG None  Radiology Ct Renal Stone Study  Result Date: 11/22/2017 CLINICAL DATA:  Left-sided flank pain.  History of kidney stones. EXAM: CT ABDOMEN AND PELVIS WITHOUT CONTRAST TECHNIQUE: Multidetector CT imaging of the abdomen and pelvis was performed following the standard protocol without IV contrast. COMPARISON:  08/11/2015 FINDINGS: Lower chest: Ground-glass airspace consolidation in the lingula. Small hiatal hernia. Hepatobiliary: No focal liver abnormality is seen. No gallstones, gallbladder wall thickening, or biliary dilatation. Pancreas: Unremarkable. No pancreatic ductal dilatation or  surrounding inflammatory changes. Spleen: Normal in size without focal abnormality. Adrenals/Urinary Tract: Normal adrenal glands. Normal appearance of the right kidney right ureter and urinary bladder. 4 mm obstructive stone in the proximal left ureter with mild upstream left hydroureter and hydronephrosis. No inflammatory left perinephric stranding. Stomach/Bowel: Stomach is within normal limits. Post appendectomy. No evidence of bowel wall thickening, distention, or inflammatory changes. Vascular/Lymphatic: Aortic atherosclerosis. No enlarged abdominal or pelvic lymph nodes. Reproductive: Prostate is unremarkable. Other: No abdominal wall hernia or abnormality. No abdominopelvic ascites. Musculoskeletal: No acute or significant osseous findings. IMPRESSION: Obstructive 4 mm left proximal ureteral calculus causing mild left hydroureter and hydronephrosis. No perinephric inflammatory changes. Electronically Signed   By: Fidela Salisbury M.D.   On: 11/22/2017 20:17    Procedures Procedures (including  critical care time)  Medications Ordered in ED Medications  0.9 %  sodium chloride infusion ( Intravenous New Bag/Given 11/22/17 1940)  sodium chloride 0.9 % bolus 1,000 mL (0 mLs Intravenous Stopped 11/22/17 2036)  ondansetron (ZOFRAN) injection 4 mg (4 mg Intravenous Given 11/22/17 1941)  HYDROmorphone (DILAUDID) injection 1 mg (1 mg Intravenous Given 11/22/17 1941)  cefTRIAXone (ROCEPHIN) 1 g in sodium chloride 0.9 % 100 mL IVPB (0 g Intravenous Stopped 11/22/17 2218)  HYDROmorphone (DILAUDID) injection 1 mg (1 mg Intravenous Given 11/22/17 2100)  ketorolac (TORADOL) 30 MG/ML injection 30 mg (30 mg Intravenous Given 11/22/17 2100)  ondansetron (ZOFRAN) injection 4 mg (4 mg Intravenous Given 11/22/17 2100)     Initial Impression / Assessment and Plan / ED Course  I have reviewed the triage vital signs and the nursing notes.  Pertinent labs & imaging results that were available during my care of the patient  were reviewed by me and considered in my medical decision making (see chart for details).     CT scan shows a proximal left ureter 4 mm stone.  With some hydronephrosis.  Patient's urinalysis does have positive nitrite.  Urine culture sent will be started on antibiotic Keflex received 1 dose of Rocephin here.  Patient with improvement with pain medication and Toradol here.  Will be continued on Naprosyn hydrocodone and Phenergan and will be given follow-up information for urology requested to call tomorrow make an appointment.  Patient also given a work note.  Final Clinical Impressions(s) / ED Diagnoses   Final diagnoses:  Left flank pain  Ureteral colic    ED Discharge Orders        Ordered    naproxen (NAPROSYN) 500 MG tablet  2 times daily     11/22/17 2221    promethazine (PHENERGAN) 25 MG tablet  Every 6 hours PRN     11/22/17 2221    cephALEXin (KEFLEX) 500 MG capsule  4 times daily     11/22/17 2221    HYDROcodone-acetaminophen (NORCO/VICODIN) 5-325 MG tablet  Every 4 hours PRN     11/22/17 2221       Fredia Sorrow, MD 11/22/17 2228

## 2017-11-22 NOTE — Discharge Instructions (Addendum)
Take the Naprosyn on a regular basis take the Phenergan for any nausea or vomiting.  Supplement with hydrocodone as needed for pain.  Start the antibiotic Keflex urine culture sent urinalysis concerning for possible developing infection.  Call urology for follow-up.  We will place the call tomorrow.  Return for any new or worse symptoms.  Work note provided.

## 2017-11-23 ENCOUNTER — Emergency Department (HOSPITAL_COMMUNITY)
Admission: EM | Admit: 2017-11-23 | Discharge: 2017-11-23 | Disposition: A | Discharge status: Home / Self Care | Payer: Self-pay | Attending: Emergency Medicine | Admitting: Emergency Medicine

## 2017-11-23 ENCOUNTER — Encounter (HOSPITAL_COMMUNITY): Payer: Self-pay | Admitting: Emergency Medicine

## 2017-11-23 DIAGNOSIS — N23 Unspecified renal colic: Secondary | ICD-10-CM | POA: Insufficient documentation

## 2017-11-23 DIAGNOSIS — I10 Essential (primary) hypertension: Secondary | ICD-10-CM | POA: Insufficient documentation

## 2017-11-23 DIAGNOSIS — Z87891 Personal history of nicotine dependence: Secondary | ICD-10-CM | POA: Insufficient documentation

## 2017-11-23 MED ORDER — KETOROLAC TROMETHAMINE 30 MG/ML IJ SOLN
30.0000 mg | Freq: Once | INTRAMUSCULAR | Status: AC
  Administered 2017-11-23: 30 mg via INTRAVENOUS
  Filled 2017-11-23: qty 1

## 2017-11-23 MED ORDER — HYDROMORPHONE HCL 1 MG/ML IJ SOLN
1.0000 mg | Freq: Once | INTRAMUSCULAR | Status: AC
  Administered 2017-11-23: 1 mg via INTRAVENOUS
  Filled 2017-11-23: qty 1

## 2017-11-23 MED ORDER — OXYCODONE-ACETAMINOPHEN 5-325 MG PO TABS
1.0000 | ORAL_TABLET | Freq: Once | ORAL | Status: AC
  Administered 2017-11-23: 1 via ORAL
  Filled 2017-11-23: qty 1

## 2017-11-23 MED ORDER — ONDANSETRON HCL 4 MG/2ML IJ SOLN
4.0000 mg | Freq: Once | INTRAMUSCULAR | Status: AC
  Administered 2017-11-23: 4 mg via INTRAVENOUS
  Filled 2017-11-23: qty 2

## 2017-11-23 MED ORDER — HYDROMORPHONE HCL 1 MG/ML IJ SOLN
1.0000 mg | Freq: Once | INTRAMUSCULAR | Status: AC
Start: 2017-11-23 — End: 2017-11-23
  Administered 2017-11-23: 1 mg via INTRAVENOUS
  Filled 2017-11-23: qty 1

## 2017-11-23 MED FILL — Hydrocodone-Acetaminophen Tab 5-325 MG: ORAL | Qty: 6 | Status: AC

## 2017-11-23 NOTE — ED Provider Notes (Signed)
[facility] EMERGENCY DEPARTMENT Provider Note   CSN: [id number] Arrival date & time: 11/23/17  0537     History   Chief Complaint Chief Complaint  Patient presents with  . Flank Pain    HPI Alec Carter is a 42 y.o. male.  Patient returns to the ER with flank pain.  Patient was seen earlier and diagnosed with a kidney stone.  Since leaving the ER, however, pain has returned and has become severe.  He reports constant sharp pain that is 10 out of 10.  He was unable to fill his prescriptions.     Past Medical History:  Diagnosis Date  . Anxiety   . Bipolar 1 disorder ([place])   . Blind left eye    injury age 44  . Chest pain at rest, negative MI, r/o cardiac though anxiety may be component 04/10/2012  . Chronic back pain   . Depression   . Hypertension   . Kidney stones   . Reflux     Patient Active Problem List   Diagnosis Date Noted  . Chronic pain disorder 06/11/2013  . Insomnia 06/11/2013  . Hepatic steatosis 05/18/2013  . Bipolar 1 disorder ([employer]) 03/01/2013  . Polycythemia 04/09/2012  . Multiple pulmonary nodules 04/09/2012  . Elevated liver enzymes, persistent 04/09/2012  . Anxiety disorder 04/09/2012    Past Surgical History:  Procedure Laterality Date  . APPENDECTOMY          Home Medications    Prior to Admission medications   Medication Sig Start Date End Date Taking? Authorizing Provider  cephALEXin (KEFLEX) 500 MG capsule Take 1 capsule (500 mg total) by mouth 4 (four) times daily. 11/22/17   Fredia Sorrow, MD  HYDROcodone-acetaminophen (NORCO/VICODIN) 5-325 MG tablet Take 2 tablets by mouth every 4 (four) hours as needed. 11/22/17   Fredia Sorrow, MD  naproxen (NAPROSYN) 500 MG tablet Take 1 tablet (500 mg total) by mouth 2 (two) times daily. 11/22/17   Fredia Sorrow, MD  promethazine (PHENERGAN) 25 MG tablet Take 1 tablet (25 mg total) by mouth every 6 (six) hours as needed. 11/22/17   Fredia Sorrow, MD  ranitidine (ZANTAC) 150 MG capsule  Take 150 mg by mouth as needed for heartburn.    [provider]    Family History Family History  Problem Relation Age of Onset  . Diabetes Mother   . Depression Mother   . Diabetes Father   . Depression Father   . Asthma Sister   . Depression Sister   . Cancer Maternal Aunt        lung   . Heart disease Maternal Grandmother   . Hypertension Maternal Grandmother   . Diabetes Maternal Grandmother     Social History Social History   Tobacco Use  . Smoking status: Former Smoker    Packs/day: 0.50    Types: Cigarettes  . Smokeless tobacco: Never Used  Substance Use Topics  . Alcohol use: No  . Drug use: No     Allergies   Patient has no known allergies.   Review of Systems Review of Systems  Genitourinary: Positive for flank pain.  All other systems reviewed and are negative.    Physical Exam Updated Vital Signs BP 108/69   Pulse (!) 49   Temp 97.9 F (36.6 C) (Oral)   Resp 16   Ht 6' (1.829 m)   Wt 90.7 kg (200 lb)   SpO2 98%   BMI 27.12 kg/m   Physical Exam  Constitutional: He is oriented to person, place, and time. He appears well-developed and well-nourished. He appears distressed.  HENT:  Head: Normocephalic and atraumatic.  Right Ear: Hearing normal.  Left Ear: Hearing normal.  Nose: Nose normal.  Mouth/Throat: Oropharynx is clear and moist and mucous membranes are normal.  Eyes: Pupils are equal, round, and reactive to light. Conjunctivae and EOM are normal.  Neck: Normal range of motion. Neck supple.  Cardiovascular: Regular rhythm, S1 normal and S2 normal. Exam reveals no gallop and no friction rub.  No murmur heard. Pulmonary/Chest: Effort normal and breath sounds normal. No respiratory distress. He exhibits no tenderness.  Abdominal: Soft. Normal appearance and bowel sounds are normal. There is no hepatosplenomegaly. There is no tenderness. There is no rebound, no guarding, no tenderness at McBurney's point and negative Murphy's  sign. No hernia.  Musculoskeletal: Normal range of motion.  Neurological: He is alert and oriented to person, place, and time. He has normal strength. No cranial nerve deficit or sensory deficit. Coordination normal. GCS eye subscore is 4. GCS verbal subscore is 5. GCS motor subscore is 6.  Skin: Skin is warm, dry and intact. No rash noted. No cyanosis.  Psychiatric: He has a normal mood and affect. His speech is normal and behavior is normal. Thought content normal.  Nursing note and vitals reviewed.    ED Treatments / Results  Labs (all labs ordered are listed, but only abnormal results are displayed) Labs Reviewed - No data to display  EKG None  Radiology Ct Renal Stone Study  Result Date: 11/22/2017 CLINICAL DATA:  Left-sided flank pain.  History of kidney stones. EXAM: CT ABDOMEN AND PELVIS WITHOUT CONTRAST TECHNIQUE: Multidetector CT imaging of the abdomen and pelvis was performed following the standard protocol without IV contrast. COMPARISON:  08/11/2015 FINDINGS: Lower chest: Ground-glass airspace consolidation in the lingula. Small hiatal hernia. Hepatobiliary: No focal liver abnormality is seen. No gallstones, gallbladder wall thickening, or biliary dilatation. Pancreas: Unremarkable. No pancreatic ductal dilatation or surrounding inflammatory changes. Spleen: Normal in size without focal abnormality. Adrenals/Urinary Tract: Normal adrenal glands. Normal appearance of the right kidney right ureter and urinary bladder. 4 mm obstructive stone in the proximal left ureter with mild upstream left hydroureter and hydronephrosis. No inflammatory left perinephric stranding. Stomach/Bowel: Stomach is within normal limits. Post appendectomy. No evidence of bowel wall thickening, distention, or inflammatory changes. Vascular/Lymphatic: Aortic atherosclerosis. No enlarged abdominal or pelvic lymph nodes. Reproductive: Prostate is unremarkable. Other: No abdominal wall hernia or abnormality. No  abdominopelvic ascites. Musculoskeletal: No acute or significant osseous findings. IMPRESSION: Obstructive 4 mm left proximal ureteral calculus causing mild left hydroureter and hydronephrosis. No perinephric inflammatory changes. Electronically Signed   By: Fidela Salisbury M.D.   On: 11/22/2017 20:17    Procedures Procedures (including critical care time)  Medications Ordered in ED Medications  oxyCODONE-acetaminophen (PERCOCET/ROXICET) 5-325 MG per tablet 1 tablet (has no administration in time range)  HYDROmorphone (DILAUDID) injection 1 mg (1 mg Intravenous Given 11/23/17 0559)  ondansetron (ZOFRAN) injection 4 mg (4 mg Intravenous Given 11/23/17 0559)  HYDROmorphone (DILAUDID) injection 1 mg (1 mg Intravenous Given 11/23/17 0709)  ketorolac (TORADOL) 30 MG/ML injection 30 mg (30 mg Intravenous Given 11/23/17 0709)     Initial Impression / Assessment and Plan / ED Course  I have reviewed the triage vital signs and the nursing notes.  Pertinent labs & imaging results that were available during my care of the patient were reviewed by me and considered in my medical  decision making (see chart for details).     Patient returns with flank pain.  He was seen earlier and diagnosed with ureteral stone.  He was unable to get his prescriptions filled because his pharmacy was closed.  Pain progressively worsened and is now back to where it was when he first was seen.  Records reviewed.  He did have a 4 mm stone noted on CT.  He did have titrate in his hearing, was covered with Rocephin.  He is not febrile.  He did not have a leukocytosis.  He does not appear septic.  Patient provided analgesia with improvement.  He will contact urology today for further management.  Final Clinical Impressions(s) / ED Diagnoses   Final diagnoses:  Ureteral colic    ED Discharge Orders    None       Orpah Greek, MD 11/23/17 [phone]

## 2017-11-23 NOTE — ED Triage Notes (Signed)
Pt complains of worsening pain. Pt states the pain is from kidney stones. Pt was unable to get his medications because the pharmacy was not open. Pt states he is able to urinate. The pain is a level 10 on a scale of 0 to 10.

## 2017-11-24 LAB — URINE CULTURE: Culture: NO GROWTH

## 2017-12-01 ENCOUNTER — Other Ambulatory Visit: Payer: Self-pay | Admitting: Urology

## 2017-12-01 ENCOUNTER — Encounter (HOSPITAL_BASED_OUTPATIENT_CLINIC_OR_DEPARTMENT_OTHER): Payer: Self-pay | Admitting: *Deleted

## 2017-12-01 ENCOUNTER — Emergency Department (HOSPITAL_COMMUNITY)
Admission: EM | Admit: 2017-12-01 | Discharge: 2017-12-01 | Disposition: A | Discharge status: Home / Self Care | Payer: Self-pay | Attending: Emergency Medicine | Admitting: Emergency Medicine

## 2017-12-01 ENCOUNTER — Encounter (HOSPITAL_COMMUNITY): Payer: Self-pay | Admitting: *Deleted

## 2017-12-01 ENCOUNTER — Other Ambulatory Visit: Payer: Self-pay

## 2017-12-01 DIAGNOSIS — Z87891 Personal history of nicotine dependence: Secondary | ICD-10-CM | POA: Insufficient documentation

## 2017-12-01 DIAGNOSIS — N201 Calculus of ureter: Secondary | ICD-10-CM | POA: Insufficient documentation

## 2017-12-01 DIAGNOSIS — I10 Essential (primary) hypertension: Secondary | ICD-10-CM | POA: Insufficient documentation

## 2017-12-01 LAB — URINALYSIS, ROUTINE W REFLEX MICROSCOPIC
BILIRUBIN URINE: NEGATIVE
Bacteria, UA: NONE SEEN
Glucose, UA: NEGATIVE mg/dL
Ketones, ur: NEGATIVE mg/dL
Leukocytes, UA: NEGATIVE
NITRITE: NEGATIVE
PH: 6 (ref 5.0–8.0)
Protein, ur: NEGATIVE mg/dL
RBC / HPF: >50 RBC/hpf — ABNORMAL HIGH (ref 0–5)
SPECIFIC GRAVITY, URINE: 1.014 (ref 1.005–1.030)

## 2017-12-01 MED ORDER — ONDANSETRON HCL 4 MG/2ML IJ SOLN
4.0000 mg | Freq: Once | INTRAMUSCULAR | Status: AC
  Administered 2017-12-01: 4 mg via INTRAVENOUS
  Filled 2017-12-01: qty 2

## 2017-12-01 MED ORDER — KETOROLAC TROMETHAMINE 30 MG/ML IJ SOLN
30.0000 mg | Freq: Once | INTRAMUSCULAR | Status: AC
  Administered 2017-12-01: 30 mg via INTRAVENOUS
  Filled 2017-12-01: qty 1

## 2017-12-01 MED ORDER — HYDROMORPHONE HCL 1 MG/ML IJ SOLN
0.5000 mg | Freq: Once | INTRAMUSCULAR | Status: AC
Start: 2017-12-01 — End: 2017-12-01
  Administered 2017-12-01: 0.5 mg via INTRAVENOUS
  Filled 2017-12-01: qty 1

## 2017-12-01 MED ORDER — HYDROMORPHONE HCL 1 MG/ML IJ SOLN
1.0000 mg | Freq: Once | INTRAMUSCULAR | Status: AC
  Administered 2017-12-01: 1 mg via INTRAVENOUS
  Filled 2017-12-01: qty 1

## 2017-12-01 NOTE — Discharge Instructions (Addendum)
DO NOT EAT OR DRINK ANYTHING BEFORE YOU ARE SEEN BY DR [facility] THIS MORNING. He wants you to be at the office around 10:15 to do paperwork and he will see you around 10:45 am.

## 2017-12-01 NOTE — ED Triage Notes (Signed)
Pt was seen here last week for same complaint; pt is still having pain to left flank area;
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
## 2017-12-01 NOTE — ED Notes (Signed)
ED Provider at bedside. 

## 2017-12-02 ENCOUNTER — Encounter (HOSPITAL_BASED_OUTPATIENT_CLINIC_OR_DEPARTMENT_OTHER): Payer: Self-pay | Admitting: *Deleted

## 2017-12-02 ENCOUNTER — Other Ambulatory Visit: Payer: Self-pay

## 2017-12-02 NOTE — Progress Notes (Signed)
SPOKE W/ PT VIA PHONE FOR PRE-OP INTERVIEW.  NPO AFTER MN.  ARRIVE AT 0630.  CURRENT LAB RESULTS IN CHART AND Epic.  WILL TAKE ZANTAC AM DOS W/ SIPS OF WATER AND IF NEEDED TAKE OXYCODONE/PHENERGAN.

## 2017-12-03 MED ORDER — GENTAMICIN SULFATE 40 MG/ML IJ SOLN
5.0000 mg/kg | INTRAVENOUS | Status: DC
  Filled 2017-12-03: qty 10.25

## 2017-12-04 ENCOUNTER — Encounter (HOSPITAL_BASED_OUTPATIENT_CLINIC_OR_DEPARTMENT_OTHER)
Admission: RE | Disposition: A | Discharge status: Home / Self Care | Payer: Self-pay | Source: Ambulatory Visit | Attending: Urology

## 2017-12-04 ENCOUNTER — Ambulatory Visit (HOSPITAL_BASED_OUTPATIENT_CLINIC_OR_DEPARTMENT_OTHER): Payer: Self-pay | Admitting: Anesthesiology

## 2017-12-04 ENCOUNTER — Ambulatory Visit (HOSPITAL_BASED_OUTPATIENT_CLINIC_OR_DEPARTMENT_OTHER)
Admission: RE | Admit: 2017-12-04 | Discharge: 2017-12-04 | Disposition: A | Discharge status: Home / Self Care | Payer: Self-pay | Source: Ambulatory Visit | Attending: Urology | Admitting: Urology

## 2017-12-04 ENCOUNTER — Encounter (HOSPITAL_BASED_OUTPATIENT_CLINIC_OR_DEPARTMENT_OTHER): Payer: Self-pay

## 2017-12-04 DIAGNOSIS — K219 Gastro-esophageal reflux disease without esophagitis: Secondary | ICD-10-CM | POA: Insufficient documentation

## 2017-12-04 DIAGNOSIS — Z79899 Other long term (current) drug therapy: Secondary | ICD-10-CM | POA: Insufficient documentation

## 2017-12-04 DIAGNOSIS — Z87891 Personal history of nicotine dependence: Secondary | ICD-10-CM | POA: Insufficient documentation

## 2017-12-04 DIAGNOSIS — N201 Calculus of ureter: Secondary | ICD-10-CM | POA: Insufficient documentation

## 2017-12-04 HISTORY — DX: Personal history of self-harm: Z91.5

## 2017-12-04 HISTORY — PX: CYSTOSCOPY WITH RETROGRADE PYELOGRAM, URETEROSCOPY AND STENT PLACEMENT: SHX5789

## 2017-12-04 HISTORY — DX: Personal history of urinary calculi: Z87.442

## 2017-12-04 HISTORY — DX: Personal history of other specified conditions: Z87.898

## 2017-12-04 HISTORY — DX: Diaphragmatic hernia without obstruction or gangrene: K44.9

## 2017-12-04 HISTORY — DX: Presence of spectacles and contact lenses: Z97.3

## 2017-12-04 HISTORY — DX: Low back pain: M54.5

## 2017-12-04 HISTORY — DX: Low back pain, unspecified: M54.50

## 2017-12-04 HISTORY — DX: Calculus of ureter: N20.1

## 2017-12-04 HISTORY — DX: Gastro-esophageal reflux disease without esophagitis: K21.9

## 2017-12-04 HISTORY — DX: Other nonspecific abnormal finding of lung field: R91.8

## 2017-12-04 HISTORY — DX: Unspecified osteoarthritis, unspecified site: M19.90

## 2017-12-04 HISTORY — DX: Anxiety disorder, unspecified: F41.9

## 2017-12-04 HISTORY — DX: Fatty (change of) liver, not elsewhere classified: K76.0

## 2017-12-04 HISTORY — DX: Personal history of other (healed) physical injury and trauma: Z87.828

## 2017-12-04 HISTORY — DX: Other chronic pain: G89.29

## 2017-12-04 HISTORY — DX: Personal history of diseases of the skin and subcutaneous tissue: Z87.2

## 2017-12-04 HISTORY — PX: HOLMIUM LASER APPLICATION: SHX5852

## 2017-12-04 SURGERY — CYSTOURETEROSCOPY, WITH RETROGRADE PYELOGRAM AND STENT INSERTION
Anesthesia: General | Site: Ureter | Laterality: Left

## 2017-12-04 MED ORDER — SODIUM CHLORIDE 0.9 % IR SOLN
Status: DC | PRN
  Administered 2017-12-04: 3000 mL

## 2017-12-04 MED ORDER — ONDANSETRON HCL 4 MG/2ML IJ SOLN
INTRAMUSCULAR | Status: AC
Start: 2017-12-04 — End: ?
  Filled 2017-12-04: qty 2

## 2017-12-04 MED ORDER — OXYCODONE HCL 5 MG PO TABS
5.0000 mg | ORAL_TABLET | Freq: Once | ORAL | Status: AC
  Administered 2017-12-04: 5 mg via ORAL
  Filled 2017-12-04: qty 1

## 2017-12-04 MED ORDER — LACTATED RINGERS IV SOLN
INTRAVENOUS | Status: DC
  Administered 2017-12-04: 1000 mL via INTRAVENOUS
  Filled 2017-12-04: qty 1000

## 2017-12-04 MED ORDER — KETOROLAC TROMETHAMINE 30 MG/ML IJ SOLN
INTRAMUSCULAR | Status: AC
  Filled 2017-12-04: qty 1

## 2017-12-04 MED ORDER — MEPERIDINE HCL 25 MG/ML IJ SOLN
6.2500 mg | INTRAMUSCULAR | Status: DC | PRN
  Filled 2017-12-04: qty 1

## 2017-12-04 MED ORDER — DEXAMETHASONE SODIUM PHOSPHATE 10 MG/ML IJ SOLN
INTRAMUSCULAR | Status: AC
  Filled 2017-12-04: qty 1

## 2017-12-04 MED ORDER — MIDAZOLAM HCL 2 MG/2ML IJ SOLN
INTRAMUSCULAR | Status: AC
  Filled 2017-12-04: qty 2

## 2017-12-04 MED ORDER — LIDOCAINE 2% (20 MG/ML) 5 ML SYRINGE
INTRAMUSCULAR | Status: AC
  Filled 2017-12-04: qty 5

## 2017-12-04 MED ORDER — METOCLOPRAMIDE HCL 5 MG/ML IJ SOLN
10.0000 mg | Freq: Once | INTRAMUSCULAR | Status: DC | PRN
  Filled 2017-12-04: qty 2

## 2017-12-04 MED ORDER — GENTAMICIN SULFATE 40 MG/ML IJ SOLN
5.0000 mg/kg | INTRAVENOUS | Status: AC
  Administered 2017-12-04: 440 mg via INTRAVENOUS
  Filled 2017-12-04 (×2): qty 11

## 2017-12-04 MED ORDER — FENTANYL CITRATE (PF) 100 MCG/2ML IJ SOLN
25.0000 ug | INTRAMUSCULAR | Status: DC | PRN
  Administered 2017-12-04: 50 ug via INTRAVENOUS
  Administered 2017-12-04 (×2): 25 ug via INTRAVENOUS
  Filled 2017-12-04: qty 1

## 2017-12-04 MED ORDER — OXYCODONE HCL 5 MG PO TABS
ORAL_TABLET | ORAL | Status: AC
  Filled 2017-12-04: qty 1

## 2017-12-04 MED ORDER — ONDANSETRON HCL 4 MG/2ML IJ SOLN
INTRAMUSCULAR | Status: DC | PRN
  Administered 2017-12-04: 30 mg via INTRAVENOUS

## 2017-12-04 MED ORDER — GLYCOPYRROLATE 0.2 MG/ML IV SOSY
PREFILLED_SYRINGE | INTRAVENOUS | Status: AC
  Filled 2017-12-04: qty 5

## 2017-12-04 MED ORDER — DEXAMETHASONE SODIUM PHOSPHATE 10 MG/ML IJ SOLN
INTRAMUSCULAR | Status: DC | PRN
  Administered 2017-12-04: 10 mg via INTRAVENOUS

## 2017-12-04 MED ORDER — PROPOFOL 10 MG/ML IV BOLUS
INTRAVENOUS | Status: AC
  Filled 2017-12-04: qty 40

## 2017-12-04 MED ORDER — MIDAZOLAM HCL 2 MG/2ML IJ SOLN
INTRAMUSCULAR | Status: DC | PRN
  Administered 2017-12-04: 2 mg via INTRAVENOUS

## 2017-12-04 MED ORDER — KETOROLAC TROMETHAMINE 10 MG PO TABS: 10.0000 mg | ORAL_TABLET | Freq: Four times a day (QID) | ORAL | 1 refills | Status: AC | PRN

## 2017-12-04 MED ORDER — LIDOCAINE 2% (20 MG/ML) 5 ML SYRINGE
INTRAMUSCULAR | Status: DC | PRN
  Administered 2017-12-04: 60 mg via INTRAVENOUS

## 2017-12-04 MED ORDER — FENTANYL CITRATE (PF) 100 MCG/2ML IJ SOLN
INTRAMUSCULAR | Status: AC
  Filled 2017-12-04: qty 2

## 2017-12-04 MED ORDER — PROPOFOL 10 MG/ML IV BOLUS
INTRAVENOUS | Status: DC | PRN
  Administered 2017-12-04: 250 mg via INTRAVENOUS

## 2017-12-04 MED ORDER — LACTATED RINGERS IV SOLN
INTRAVENOUS | Status: DC
  Filled 2017-12-04: qty 1000

## 2017-12-04 MED ORDER — GLYCOPYRROLATE 0.2 MG/ML IV SOSY
PREFILLED_SYRINGE | INTRAVENOUS | Status: DC | PRN
  Administered 2017-12-04: .2 mg via INTRAVENOUS

## 2017-12-04 MED ORDER — WHITE PETROLATUM EX OINT
TOPICAL_OINTMENT | CUTANEOUS | Status: AC
  Filled 2017-12-04: qty 5

## 2017-12-04 MED ORDER — FENTANYL CITRATE (PF) 100 MCG/2ML IJ SOLN
INTRAMUSCULAR | Status: DC | PRN
  Administered 2017-12-04: 25 ug via INTRAVENOUS
  Administered 2017-12-04 (×2): 50 ug via INTRAVENOUS

## 2017-12-04 MED ORDER — KETOROLAC TROMETHAMINE 30 MG/ML IJ SOLN
INTRAMUSCULAR | Status: DC | PRN
  Administered 2017-12-04: 30 mg via INTRAVENOUS

## 2017-12-04 MED ORDER — IOHEXOL 300 MG/ML  SOLN
INTRAMUSCULAR | Status: DC | PRN
  Administered 2017-12-04: 13 mL

## 2017-12-04 MED ORDER — CEPHALEXIN 500 MG PO CAPS: 500.0000 mg | ORAL_CAPSULE | Freq: Two times a day (BID) | ORAL | 0 refills | Status: DC

## 2017-12-04 SURGICAL SUPPLY — 30 items
BAG DRAIN URO-CYSTO SKYTR STRL (DRAIN) ×3 IMPLANT
BAG DRN UROCATH (DRAIN) ×1
BASKET LASER NITINOL 1.9FR (BASKET) ×2 IMPLANT
BSKT STON RTRVL 120 1.9FR (BASKET) ×1
CATH INTERMIT  6FR 70CM (CATHETERS) IMPLANT
CLOTH BEACON ORANGE TIMEOUT ST (SAFETY) ×3 IMPLANT
FIBER LASER FLEXIVA 365 (UROLOGICAL SUPPLIES) IMPLANT
FIBER LASER TRAC TIP (UROLOGICAL SUPPLIES) ×2 IMPLANT
GLOVE BIO SURGEON STRL SZ 6.5 (GLOVE) ×1 IMPLANT
GLOVE BIO SURGEON STRL SZ7.5 (GLOVE) ×3 IMPLANT
GLOVE BIO SURGEONS STRL SZ 6.5 (GLOVE) ×1
GLOVE BIOGEL PI IND STRL 6.5 (GLOVE) IMPLANT
GLOVE BIOGEL PI INDICATOR 6.5 (GLOVE) ×4
GOWN STRL REUS W/TWL LRG LVL3 (GOWN DISPOSABLE) ×5 IMPLANT
GUIDEWIRE ANG ZIPWIRE 038X150 (WIRE) ×3 IMPLANT
GUIDEWIRE STR DUAL SENSOR (WIRE) ×3 IMPLANT
INFUSOR MANOMETER BAG 3000ML (MISCELLANEOUS) ×1 IMPLANT
IV NS 1000ML (IV SOLUTION)
IV NS 1000ML BAXH (IV SOLUTION) ×1 IMPLANT
IV NS IRRIG 3000ML ARTHROMATIC (IV SOLUTION) ×3 IMPLANT
KIT TURNOVER CYSTO (KITS) ×3 IMPLANT
MANIFOLD NEPTUNE II (INSTRUMENTS) ×3 IMPLANT
NS IRRIG 500ML POUR BTL (IV SOLUTION) ×4 IMPLANT
PACK CYSTO (CUSTOM PROCEDURE TRAY) ×3 IMPLANT
STENT POLARIS 5FRX24X.038 (STENTS) ×2 IMPLANT
SYRINGE 10CC LL (SYRINGE) ×3 IMPLANT
TUBE CONNECTING 12'X1/4 (SUCTIONS) ×1
TUBE CONNECTING 12X1/4 (SUCTIONS) ×1 IMPLANT
TUBE FEEDING 8FR 16IN STR KANG (MISCELLANEOUS) IMPLANT
TUBING UROLOGY SET (TUBING) ×2 IMPLANT

## 2017-12-04 NOTE — Anesthesia Preprocedure Evaluation (Signed)
Anesthesia Evaluation  Patient identified by MRN, date of birth, ID band Patient awake    Reviewed: Allergy & Precautions, NPO status , Patient's Chart, lab work & pertinent test results  Airway Mallampati: II  TM Distance: >3 FB Neck ROM: Full    Dental no notable dental hx.    Pulmonary former smoker,    Pulmonary exam normal breath sounds clear to auscultation       Cardiovascular negative cardio ROS Normal cardiovascular exam Rhythm:Regular Rate:Normal     Neuro/Psych negative neurological ROS  negative psych ROS   GI/Hepatic Neg liver ROS, hiatal hernia, GERD  Medicated and Controlled,  Endo/Other  negative endocrine ROS  Renal/GU negative Renal ROS  negative genitourinary   Musculoskeletal negative musculoskeletal ROS (+)   Abdominal   Peds negative pediatric ROS (+)  Hematology negative hematology ROS (+)   Anesthesia Other Findings   Reproductive/Obstetrics negative OB ROS                             Anesthesia Physical Anesthesia Plan  ASA: II  Anesthesia Plan: General   Post-op Pain Management:    Induction: Intravenous  PONV Risk Score and Plan: 2 and Ondansetron and Midazolam  Airway Management Planned: LMA  Additional Equipment:   Intra-op Plan:   Post-operative Plan: Extubation in OR  Informed Consent: I have reviewed the patients History and Physical, chart, labs and discussed the procedure including the risks, benefits and alternatives for the proposed anesthesia with the patient or authorized representative who has indicated his/her understanding and acceptance.   Dental advisory given  Plan Discussed with: CRNA  Anesthesia Plan Comments:         Anesthesia Quick Evaluation

## 2017-12-04 NOTE — Transfer of Care (Signed)
Immediate Anesthesia Transfer of Care Note  Patient: Alec Carter  Procedure(s) Performed: Procedure(s) (LRB): CYSTOSCOPY WITH RETROGRADE PYELOGRAM, URETEROSCOPY AND STENT PLACEMENT (Left) HOLMIUM LASER APPLICATION (Left)  Patient Location: PACU  Anesthesia Type: General  Level of Consciousness: awake, oriented, sedated and patient cooperative  Airway & Oxygen Therapy: Patient Spontanous Breathing and Patient connected to face mask oxygen  Post-op Assessment: Report given to PACU RN and Post -op Vital signs reviewed and stable  Post vital signs: Reviewed and stable  Complications: No apparent anesthesia complications Last Vitals:  Vitals Value Taken Time  BP 120/80 12/04/2017  9:45 AM  Temp    Pulse 77 12/04/2017  9:49 AM  Resp 20 12/04/2017  9:49 AM  SpO2 95 % 12/04/2017  9:49 AM  Vitals shown include unvalidated device data.  Last Pain:  Vitals:   12/04/17 0945  TempSrc:   PainSc: 7       Patients Stated Pain Goal: 8 (12/04/17 0659)

## 2017-12-04 NOTE — Op Note (Signed)
NAME: Alec Carter, Alec Carter MEDICAL RECORD [id number] ACCOUNT [id number] DATE OF BIRTH:Sep 15, 1975 FACILITY: [facility] LOCATION: [facility] PHYSICIAN:Aspin Palomarez Tresa Moore, MD  OPERATIVE REPORT  DATE OF PROCEDURE:  12/04/2017  POSTOPERATIVE DIAGNOSIS:  Left ureteral stone with refractory colic.  PROCEDURE PERFORMED: 1.  Cystoscopy with left retrograde pyelogram and interpretation. 2.  Left ureteroscopy, laser lithotripsy. 3.  Insertion of left ureteral stent 5 x 26 Polaris, no tether.  ESTIMATED BLOOD LOSS:  Nil.    COMPLICATIONS:  None.  SPECIMENS:  Left ureteral stone fragments for composition analysis.  FINDINGS: 1.  Impacted left proximal ureteral stone with significant peri-mucosal edema. 2.  Complete resolution of all accessible stone fragments larger than 130 mm following holmium laser lithotripsy and basket extraction. 3.  Successful placement of left ureteral stent proximally in the pelvis and distally in the bladder.  INDICATIONS:  The patient is a pleasant 42 year old gentleman who was found on workup to have colicky left flank pain and have a left proximal ureteral stone.  He was given an initial trial of medical therapy; however, his colic was refractory.  He had 2  subsequent ER visits for pain control.  He presented to the office earlier this week.  Options were discussed including shockwave lithotripsy versus continued medical therapy versus ureteroscopy.  The stone was not easily visualized on plain film.   Therefore, he wished to proceed with the latter.  DESCRIPTION OF PROCEDURE:  Informed consent was signed and placed in the medical record.  The patient was identified. A left ureteroscope was then placed and confirmed.  A timeout was performed.  Intravenous antibiotics were administered.  General  anesthesia was introduced.  The patient was placed into the low lithotomy position.  The surgical field was scrubbed, prepped, and draped at the base of the penis, perineum, and  proximal thighs using iodine.  Next, cystourethroscopy was performed using a  22-French rigid cystoscope with offset lens.  Inspection of the anterior and posterior urethra was unremarkable.  Inspection of bladder revealed no diverticula, calcifications, or lesions.  Ureteral orifices appeared singleton.  The left ureteral  orifice was cannulated with a 6-French renal catheter, and left retrograde pyelogram was taken.  Left retrograde pyelogram demonstrated a single left ureter and single-system left kidney.  There was moderate hydronephrosis to a filling defect in the proximal ureter consistent with a stone.  A 0.038 ZIPwire was advanced to the lower pole and set  aside as a safety wire.  An 8-French feeding tube was placed in the urinary bladder for pressure relief, and a similar ureteroscopy was performed.  The distal two-thirds of the left ureter alongside the separate working wire.  At the very upper reaches  of the semi-rigid scope, a single dominant calcification was noted consistent with known stone.  It appeared to be a much too large for simple basketing.  At this point, laser energy was applied to the stone using settings of 0.3 joules and 30 Hz and  fragmented into 3 smaller pieces.  Despite very careful minimal pressure irrigation, these pieces became retrograde, positioned above the level accessible with a semirigid scope.  As such, the semirigid scope was exchanged for 12/14, 38 cm ureteral  access sheath using continuous fluoroscopic guidance to the level of the proximal ureter and taking exquisite care not to traverse further.  Then, prior visualized area and a flexible digital ureteroscopy was performed in the proximal left ureter and  systematic inspection of the left kidney, including all calices x3.  The stone fragments were  sequentially grasped and brought out in their entirety and set aside for composition analysis.  FINDINGS:  There was complete resolution of all stone fragments  larger than 130 mm within the left kidney and ureter.  Excellent hemostasis.  The access sheath was removed under continuous vision and again at the prior site of stone impaction.  There was  significant mucosal edema that was felt to likely remain obstructing for some time.  As such, I decided to leave a nontethered stent in place, and a new 5 x 26 Polaris stent was placed using cystoscopic and fluoroscopic guidance.  Good proximal and  distal points were noted.  The bladder was empty per cystoscope.  The procedure was then terminated.  The patient tolerated the procedure well and no immediate peri-procedural complications.  The patient was taken to postanesthesia care in stable condition.  LN/NUANCE  D:12/04/2017 T:12/04/2017 JOB:[id number]
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
## 2017-12-04 NOTE — Anesthesia Procedure Notes (Signed)
Procedure Name: LMA Insertion Date/Time: 12/04/2017 8:40 AM Performed by: Suan Halter, CRNA Pre-anesthesia Checklist: Patient identified, Emergency Drugs available, Suction available and Patient being monitored Patient Re-evaluated:Patient Re-evaluated prior to induction Oxygen Delivery Method: Circle system utilized Preoxygenation: Pre-oxygenation with 100% oxygen Induction Type: IV induction Ventilation: Mask ventilation without difficulty LMA: LMA inserted LMA Size: 5.0 Number of attempts: 1 Airway Equipment and Method: Bite block Placement Confirmation: positive ETCO2 Tube secured with: Tape Dental Injury: Teeth and Oropharynx as per pre-operative assessment

## 2017-12-04 NOTE — Brief Op Note (Signed)
12/04/2017  9:18 AM  PATIENT:  Alec Carter  42 y.o. male  PRE-OPERATIVE DIAGNOSIS:  LEFT URETERAL STONE  POST-OPERATIVE DIAGNOSIS:  LEFT URETERAL STONE  PROCEDURE:  Procedure(s): CYSTOSCOPY WITH RETROGRADE PYELOGRAM, URETEROSCOPY AND STENT PLACEMENT (Left) HOLMIUM LASER APPLICATION (Left)  SURGEON:  Surgeon(s) and Role:    Alexis Frock, MD - Primary  PHYSICIAN ASSISTANT:   ASSISTANTS: none   ANESTHESIA:   none  EBL:  minimal   BLOOD ADMINISTERED:none  DRAINS: none   LOCAL MEDICATIONS USED:  NONE  SPECIMEN:  Source of Specimen:  left ureteral stone fragments  DISPOSITION OF SPECIMEN:  [facility] for compositional analysis  COUNTS:  YES  TOURNIQUET:  * No tourniquets in log *  DICTATION: .Other Dictation: Dictation Number [id number]  PLAN OF CARE: Discharge to home after PACU  PATIENT DISPOSITION:  PACU - hemodynamically stable.   Delay start of Pharmacological VTE agent (>24hrs) due to surgical blood loss or risk of bleeding: not applicable

## 2017-12-04 NOTE — Discharge Instructions (Signed)
1 - You may have urinary urgency (bladder spasms) and bloody urine on / off with stent in place. This is normal.  2 - Call MD or go to ER for fever >102, severe pain / nausea / vomiting not relieved by medications, or acute change in medical status      Post Anesthesia Home Care Instructions  Activity: Get plenty of rest for the remainder of the day. A responsible individual must stay with you for 24 hours following the procedure.  For the next 24 hours, DO NOT: -Drive a car -Operate machinery -Drink alcoholic beverages -Take any medication unless instructed by your physician -Make any legal decisions or sign important papers.  Meals: Start with liquid foods such as gelatin or soup. Progress to regular foods as tolerated. Avoid greasy, spicy, heavy foods. If nausea and/or vomiting occur, drink only clear liquids until the nausea and/or vomiting subsides. Call your physician if vomiting continues.  Special Instructions/Symptoms: Your throat may feel dry or sore from the anesthesia or the breathing tube placed in your throat during surgery. If this causes discomfort, gargle with warm salt water. The discomfort should disappear within 24 hours.         

## 2017-12-04 NOTE — Anesthesia Postprocedure Evaluation (Signed)
Anesthesia Post Note  Patient: JACOBI NILE  Procedure(s) Performed: CYSTOSCOPY WITH RETROGRADE PYELOGRAM, URETEROSCOPY AND STENT PLACEMENT (Left Ureter) HOLMIUM LASER APPLICATION (Left Ureter)     Patient location during evaluation: PACU Anesthesia Type: General Level of consciousness: awake and alert Pain management: pain level controlled Vital Signs Assessment: post-procedure vital signs reviewed and stable Respiratory status: spontaneous breathing, nonlabored ventilation, respiratory function stable and patient connected to nasal cannula oxygen Cardiovascular status: blood pressure returned to baseline and stable Postop Assessment: no apparent nausea or vomiting Anesthetic complications: no    Last Vitals:  Vitals:   12/04/17 1000 12/04/17 1031  BP: 118/78 119/81  Pulse: 62 (!) 55  Resp: 13 12  Temp:  36.7 C  SpO2: 94% 97%    Last Pain:  Vitals:   12/04/17 1000  TempSrc:   PainSc: 5                  Montez Hageman

## 2017-12-07 ENCOUNTER — Encounter (HOSPITAL_BASED_OUTPATIENT_CLINIC_OR_DEPARTMENT_OTHER): Payer: Self-pay | Admitting: Urology

## 2020-02-16 IMAGING — CT CT RENAL STONE PROTOCOL
2 of 4 series · 16 of 46 positions shown, 18 images · non-contrast
Comparison: 08/11/2015

CLINICAL DATA: Left-sided flank pain.  History of kidney stones.

EXAM:
CT ABDOMEN AND PELVIS WITHOUT CONTRAST
TECHNIQUE: Multidetector CT imaging of the abdomen and pelvis was performed
following the standard protocol without IV contrast.

[Series 2: axial st · axial · 0.82mm/px · z∈[+1544,+2004]mm · 13 of 102 slices shown, 15 images]
[im 5/102  soft-tissue]
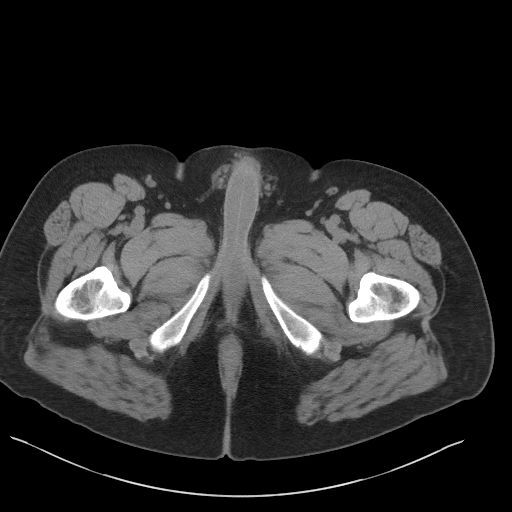
[im 5/102  bone]
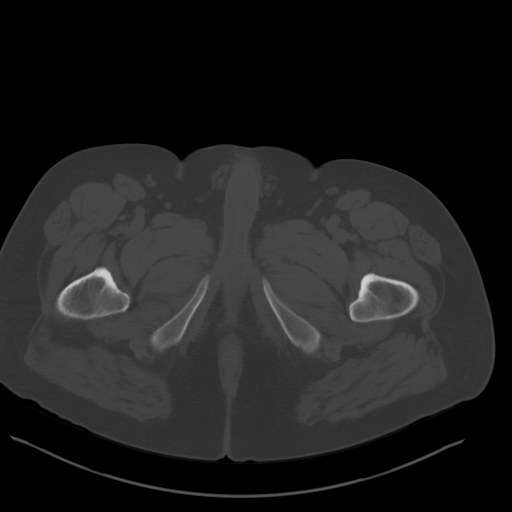
[im 13/102  soft-tissue]
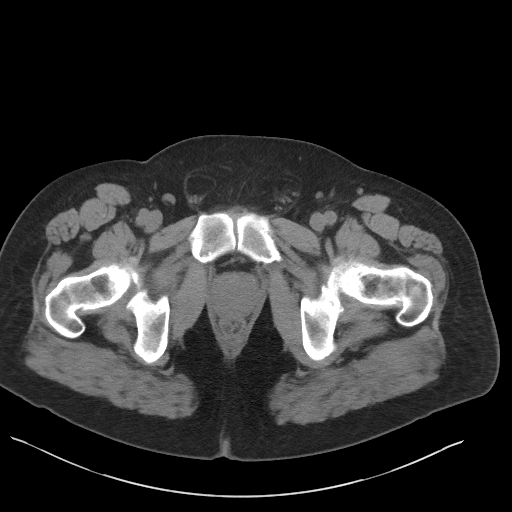
[im 21/102  soft-tissue]
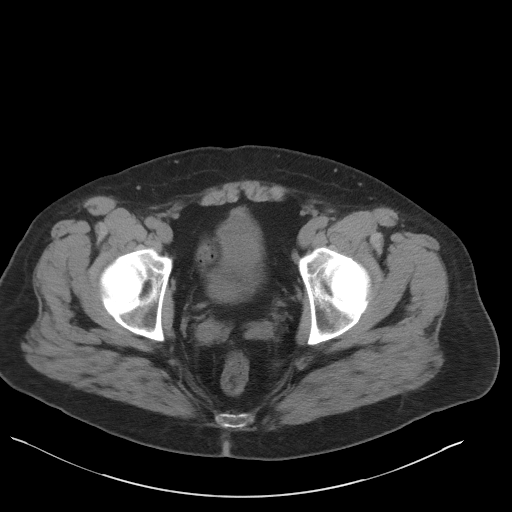
[im 29/102  soft-tissue]
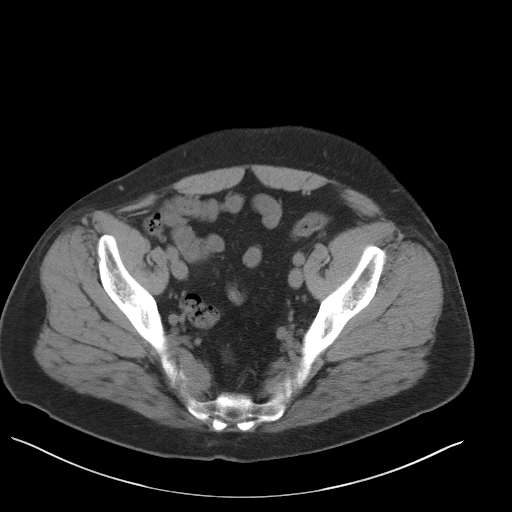
[im 37/102  soft-tissue]
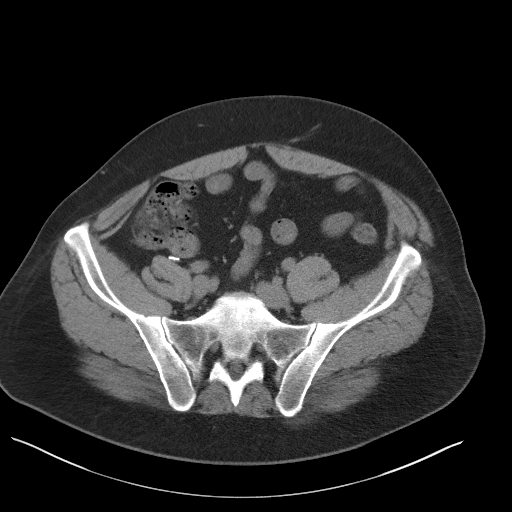
[im 45/102  soft-tissue]
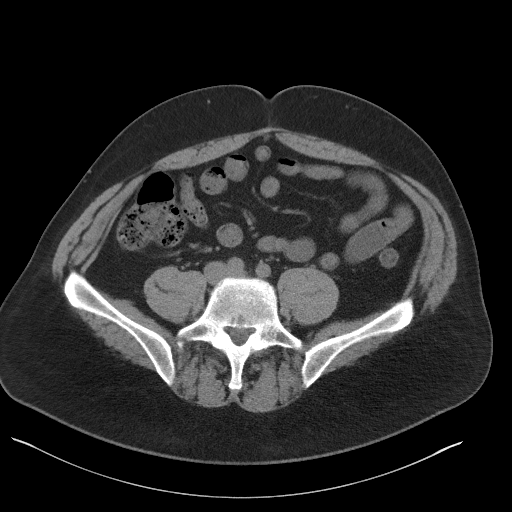
[im 53/102  soft-tissue]
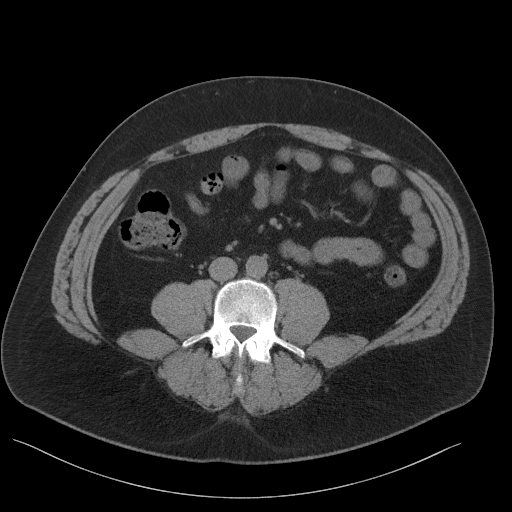
[im 57/102  soft-tissue]
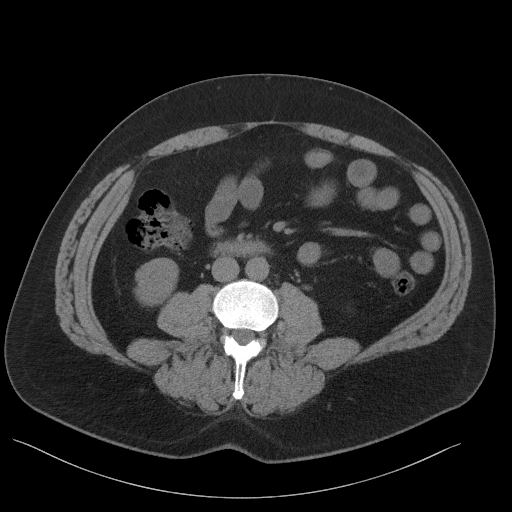
[im 65/102  soft-tissue]
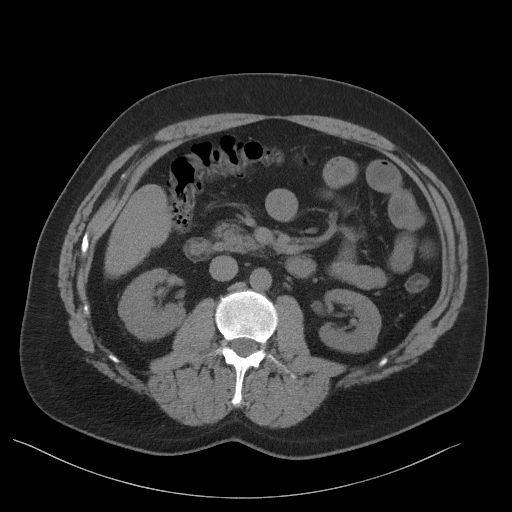
[im 65/102  bone]
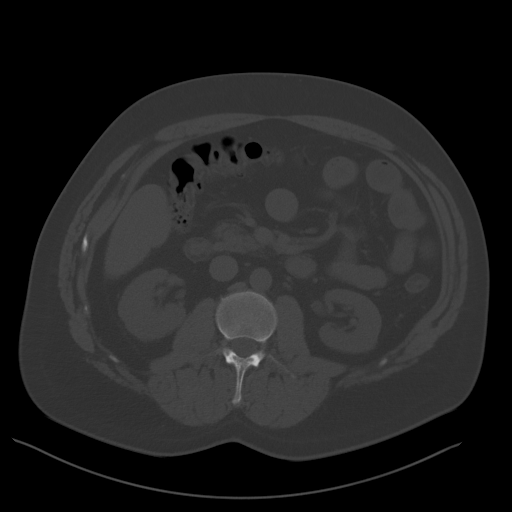
[im 73/102  soft-tissue]
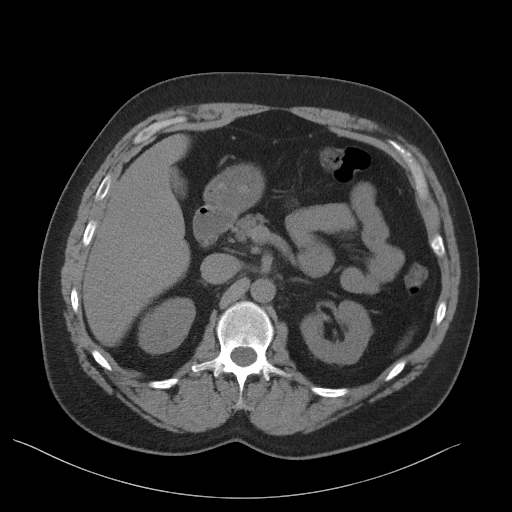
[im 81/102  soft-tissue]
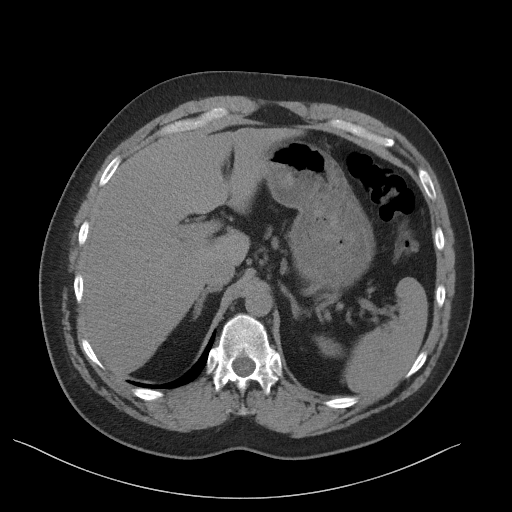
[im 89/102  soft-tissue]
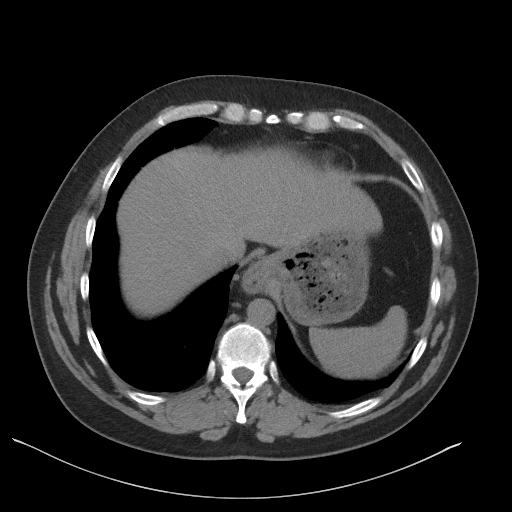
[im 97/102  soft-tissue]
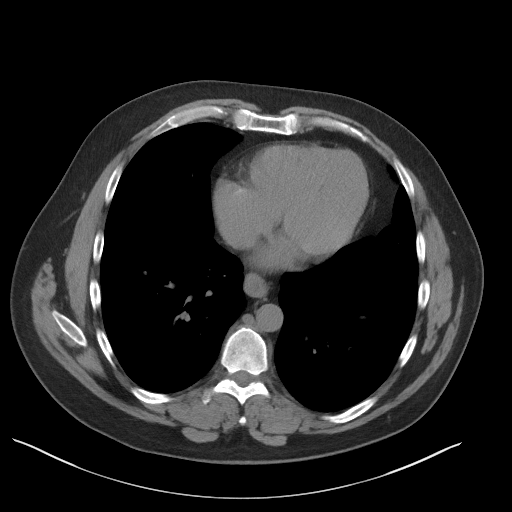

[Series 5: coronal st · coronal · 0.85mm/px · 3 of 96 slices shown]
[im 32/96  soft-tissue]
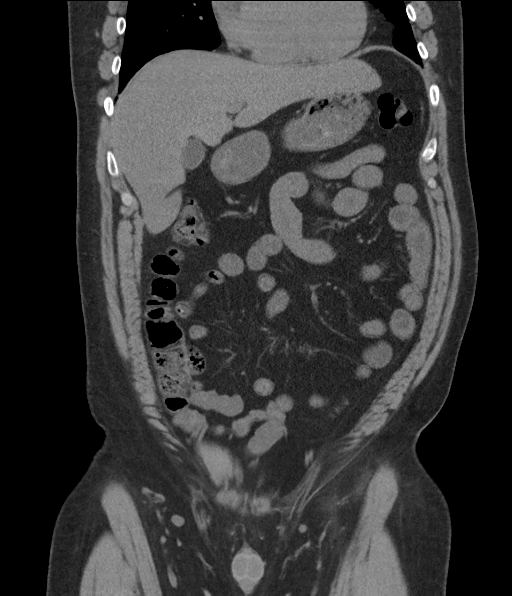
[im 43/96  soft-tissue]
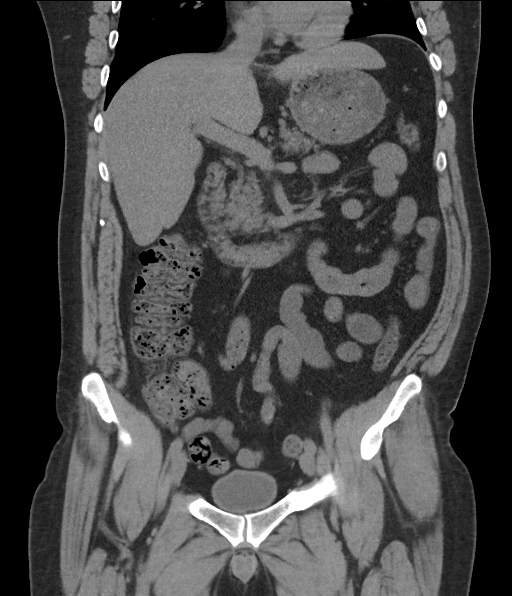
[im 53/96  soft-tissue]
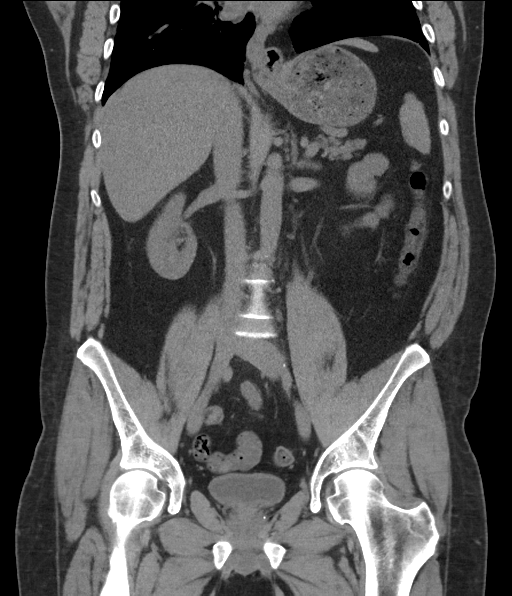

[16 of 46 positions shown; findings below may reference images not displayed]

FINDINGS: Lower chest: Ground-glass airspace consolidation in the lingula.
Small hiatal hernia.

Hepatobiliary: No focal liver abnormality is seen. No gallstones,
gallbladder wall thickening, or biliary dilatation.

Pancreas: Unremarkable. No pancreatic ductal dilatation or
surrounding inflammatory changes.

Spleen: Normal in size without focal abnormality.

Adrenals/Urinary Tract: Normal adrenal glands. Normal appearance of
the right kidney right ureter and urinary bladder. 4 mm obstructive
stone in the proximal left ureter with mild upstream left
hydroureter and hydronephrosis. No inflammatory left perinephric
stranding.

Stomach/Bowel: Stomach is within normal limits. Post appendectomy.
No evidence of bowel wall thickening, distention, or inflammatory
changes.

Vascular/Lymphatic: Aortic atherosclerosis. No enlarged abdominal or
pelvic lymph nodes.

Reproductive: Prostate is unremarkable.

Other: No abdominal wall hernia or abnormality. No abdominopelvic
ascites.

Musculoskeletal: No acute or significant osseous findings.
IMPRESSION: Obstructive 4 mm left proximal ureteral calculus causing mild left
hydroureter and hydronephrosis. No perinephric inflammatory changes.

## 2023-09-07 ENCOUNTER — Emergency Department (HOSPITAL_COMMUNITY)
Admission: EM | Admit: 2023-09-07 | Discharge: 2023-09-07 | Disposition: A | Discharge status: Home / Self Care | Payer: Commercial Managed Care - PPO | Attending: Emergency Medicine | Admitting: Emergency Medicine

## 2023-09-07 ENCOUNTER — Other Ambulatory Visit: Payer: Self-pay

## 2023-09-07 DIAGNOSIS — K047 Periapical abscess without sinus: Secondary | ICD-10-CM | POA: Insufficient documentation

## 2023-09-07 DIAGNOSIS — R220 Localized swelling, mass and lump, head: Secondary | ICD-10-CM | POA: Diagnosis present

## 2023-09-07 MED ORDER — AMOXICILLIN-POT CLAVULANATE 875-125 MG PO TABS
1.0000 | ORAL_TABLET | Freq: Once | ORAL | Status: AC
  Administered 2023-09-07: 1 via ORAL
  Filled 2023-09-07: qty 1

## 2023-09-07 MED ORDER — AMOXICILLIN-POT CLAVULANATE 875-125 MG PO TABS: 1.0000 | ORAL_TABLET | Freq: Two times a day (BID) | ORAL | 0 refills | Status: AC

## 2023-09-07 NOTE — Discharge Instructions (Signed)
A prescription for antibiotics was sent to your pharmacy.  Take as prescribed.  Call dental offices this morning to set up an appointment as soon as possible.  You will need extraction of the remaining decayed tooth for treatment of your current infection.  Continue ibuprofen and Tylenol as needed for pain.  Return to the emergency department for any new or worsening symptoms of concern.

## 2023-09-07 NOTE — ED Triage Notes (Addendum)
Patient from home, has believes to have had a sinus infection since Friday and had lost a tooth a week ago. Noticed right side facial swelling, denise any difficulty breathing or swallowing. Stated "I have been taking ibuprophen and sudafed." Had taken ibuprofen 800mg  at 3 am this morning.

## 2023-09-07 NOTE — ED Provider Notes (Signed)
[place] EMERGENCY DEPARTMENT AT [facility] Provider Note   CSN: [id number] Arrival date & time: 09/07/23  4098     History  Chief Complaint  Patient presents with   Facial Swelling    Mild discomfort, no obstruction to airway    Alec Carter is a 48 y.o. male.  HPI Patient presents for right-sided facial swelling.  Medical history includes bipolar disorder, anxiety, hepatic steatosis, depression, GERD.  He has had ongoing pain to right maxillary area.  He has been treating this with Sudafed and ibuprofen.  He has had ongoing decay in his right upper canine tooth.  This tooth recently cracked and fell out.  This morning, he woke up with swelling to the right cheek area.  He denies any systemic symptoms.    Home Medications Prior to Admission medications   Medication Sig Start Date End Date Taking? Authorizing Provider  amoxicillin-clavulanate (AUGMENTIN) 875-125 MG tablet Take 1 tablet by mouth every 12 (twelve) hours. 09/07/23  Yes Gloris Manchester, MD  ketorolac (TORADOL) 10 MG tablet Take 1 tablet (10 mg total) by mouth every 6 (six) hours as needed for moderate pain (or stent discomfort post-operatively). Pt has used prior. 12/04/17   Loletta Parish., MD  oxyCODONE-acetaminophen (PERCOCET/ROXICET) 5-325 MG tablet Take by mouth every 4 (four) hours as needed for severe pain.    [provider]  promethazine (PHENERGAN) 25 MG tablet Take 1 tablet (25 mg total) by mouth every 6 (six) hours as needed. 11/22/17   Vanetta Mulders, MD  ranitidine (ZANTAC) 150 MG capsule Take 150 mg by mouth as needed for heartburn.    [provider]      Allergies    Patient has no known allergies.    Review of Systems   Review of Systems  HENT:  Positive for dental problem and facial swelling.   All other systems reviewed and are negative.   Physical Exam Updated Vital Signs BP (!) 126/90 (BP Location: Right Arm)   Pulse 83   Temp 98.7 F (37.1 C) (Oral)    Resp 16   Ht 6\' 1"  (1.854 m)   Wt 90.7 kg   SpO2 98%   BMI 26.39 kg/m  Physical Exam Vitals and nursing note reviewed.  Constitutional:      General: He is not in acute distress.    Appearance: Normal appearance. He is well-developed. He is not ill-appearing, toxic-appearing or diaphoretic.  HENT:     Head: Normocephalic and atraumatic.      Right Ear: External ear normal.     Left Ear: External ear normal.     Nose: Nose normal.     Mouth/Throat:     Mouth: Mucous membranes are moist.     Comments: Tooth decay is present.  Root of right upper canine remains in place.  There is no gingival swelling or areas of fluctuance.  Eyes:     Extraocular Movements: Extraocular movements intact.     Conjunctiva/sclera: Conjunctivae normal.  Cardiovascular:     Rate and Rhythm: Normal rate and regular rhythm.  Pulmonary:     Effort: Pulmonary effort is normal. No respiratory distress.  Abdominal:     General: There is no distension.     Palpations: Abdomen is soft.  Musculoskeletal:        General: No swelling. Normal range of motion.     Cervical back: Normal range of motion and neck supple.  Skin:    General: Skin is  warm and dry.     Coloration: Skin is not jaundiced or pale.  Neurological:     General: No focal deficit present.     Mental Status: He is alert and oriented to person, place, and time.  Psychiatric:        Mood and Affect: Mood normal.        Behavior: Behavior normal.     ED Results / Procedures / Treatments   Labs (all labs ordered are listed, but only abnormal results are displayed) Labs Reviewed - No data to display  EKG None  Radiology No results found.  Procedures Procedures    Medications Ordered in ED Medications  amoxicillin-clavulanate (AUGMENTIN) 875-125 MG per tablet 1 tablet (has no administration in time range)    ED Course/ Medical Decision Making/ A&P                                 Medical Decision Making Risk Prescription  drug management.   Patient presents for right swelling.  Onset was this morning.  He has had ongoing decay to right upper canine.  This tooth recently cracked and most of it fell out.  On exam, root remains in place.  There is no surrounding gingival swelling or erythema.  There are no areas of fluctuance intraorally or on area of face.  Symptoms consistent with cellulitis from odontogenic source.  Patient was started on Augmentin.  He was advised to contact dental office and set up an appointment soon as possible for extraction of remaining decayed tooth.  He was discharged in stable condition.        Final Clinical Impression(s) / ED Diagnoses Final diagnoses:  Dental infection    Rx / DC Orders ED Discharge Orders          Ordered    amoxicillin-clavulanate (AUGMENTIN) 875-125 MG tablet  Every 12 hours        09/07/23 0731              Gloris Manchester, MD 09/07/23 [phone]
